# Patient Record
Sex: Female | Born: 1985 | Race: White | Hispanic: No | Marital: Single | Smoking: Current every day smoker
Health system: Southern US, Community
[De-identification: ages and names within clinical notes are randomized; demographics above are authoritative.]

## PROBLEM LIST (undated history)

## (undated) DIAGNOSIS — F988 Other specified behavioral and emotional disorders with onset usually occurring in childhood and adolescence: Secondary | ICD-10-CM

## (undated) DIAGNOSIS — F329 Major depressive disorder, single episode, unspecified: Secondary | ICD-10-CM

## (undated) DIAGNOSIS — F32A Depression, unspecified: Secondary | ICD-10-CM

## (undated) DIAGNOSIS — F1910 Other psychoactive substance abuse, uncomplicated: Secondary | ICD-10-CM

---
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
## 2005-05-24 ENCOUNTER — Other Ambulatory Visit: Admission: RE | Admit: 2005-05-24 | Discharge: 2005-05-24 | Payer: Self-pay | Admitting: Obstetrics and Gynecology

## 2005-10-22 ENCOUNTER — Inpatient Hospital Stay (HOSPITAL_COMMUNITY): Admission: AD | Admit: 2005-10-22 | Discharge: 2005-10-22 | Payer: Self-pay | Admitting: Obstetrics and Gynecology

## 2005-10-31 ENCOUNTER — Inpatient Hospital Stay (HOSPITAL_COMMUNITY): Admission: AD | Admit: 2005-10-31 | Discharge: 2005-10-31 | Payer: Self-pay | Admitting: Obstetrics and Gynecology

## 2005-11-02 ENCOUNTER — Inpatient Hospital Stay (HOSPITAL_COMMUNITY): Admission: AD | Admit: 2005-11-02 | Discharge: 2005-11-04 | Payer: Self-pay | Admitting: Obstetrics and Gynecology

## 2008-12-19 ENCOUNTER — Emergency Department (HOSPITAL_COMMUNITY): Admission: EM | Admit: 2008-12-19 | Discharge: 2008-12-20 | Payer: Self-pay | Admitting: Emergency Medicine

## 2009-10-29 ENCOUNTER — Inpatient Hospital Stay: Payer: Self-pay | Admitting: Psychiatry

## 2010-05-27 LAB — BASIC METABOLIC PANEL
CO2: 27 mEq/L (ref 19–32)
Calcium: 9.8 mg/dL (ref 8.4–10.5)
Creatinine, Ser: 0.69 mg/dL (ref 0.4–1.2)
GFR calc non Af Amer: >60 mL/min (ref >60)
Glucose, Bld: 99 mg/dL (ref 70–99)
Sodium: 138 mEq/L (ref 135–145)

## 2010-05-27 LAB — PREGNANCY, URINE

## 2010-05-27 LAB — DIFFERENTIAL
Basophils Absolute: 0 10*3/uL (ref 0.0–0.1)
Basophils Relative: 0 % (ref 0–1)
Eosinophils Absolute: 0.1 10*3/uL (ref 0.0–0.7)
Neutro Abs: 9 10*3/uL — ABNORMAL HIGH (ref 1.7–7.7)
Neutrophils Relative %: 66 % (ref 43–77)

## 2010-05-27 LAB — CBC
MCHC: 35 g/dL (ref 30.0–36.0)
MCV: 96 fL (ref 78.0–100.0)
Platelets: 326 10*3/uL (ref 150–400)
RBC: 4.55 MIL/uL (ref 3.87–5.11)

## 2010-05-27 LAB — ETHANOL

## 2010-05-27 LAB — RAPID URINE DRUG SCREEN, HOSP PERFORMED
Amphetamines: NOT DETECTED
Benzodiazepines: POSITIVE — AB
Opiates: POSITIVE — AB
Tetrahydrocannabinol: NOT DETECTED

## 2010-07-09 NOTE — Discharge Summary (Signed)
[name], [name] NO.:  [id number]   MEDICAL RECORD NO.:  [id number]          PATIENT TYPE:  INP   LOCATION:  9134                          FACILITY:  [facility]   PHYSICIAN:  Huel Cote, M.D. DATE OF BIRTH:  Sep 02, 1985   DATE OF ADMISSION:  11/02/2005  DATE OF DISCHARGE:  11/04/2005                                 DISCHARGE SUMMARY   DISCHARGE DIAGNOSES:  1. Term pregnancy at 38 plus weeks, delivered.  2. Pregnancy induced hypertension.  3. Status post spontaneous vaginal delivery with a moderate shoulder      dystocia.   DISCHARGE MEDICATIONS:  1. Motrin 600 mg p.o. every 6 hours.  2. Percocet 1-2 tablets p.o. every 4 hours p.r.n.   DISCHARGE FOLLOWUP:  The patient is to follow up in the office in six weeks  for her routine postpartum exam.   HOSPITAL COURSE:  The patient is a 25 year old G1, P0, who was admitted at  80 plus weeks gestation for induction of labor given increased blood  pressure noted for several weeks.  The patient had been followed closely in  the office with blood pressures reaching 160 to 190 over 100.  She had been  carefully screened for any other evidence of pre-eclampsia, however, her  labs were normal, her non-stress tests were normal, and she was  asymptomatic.  Her prenatal care was, otherwise, uneventful except for she  was a smoker prior to pregnancy and cut down to almost nothing with the  pregnancy.   Prenatal labs are as follows:  A+, antibody negative, RPR nonreactive,  rubella immune, hepatitis B surface antigen negative, HIV declined, GC  negative, chlamydia negative, group B strep negative, 1-hour Glucola 88,  quad screen normal.   Past medical history none.   Past surgical history none.   Past OB history none.   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 Pap smears were normal.   Medications included prenatal vitamins.   She is allergic to AMOXICILLIN and ZINC OXIDE.   On admission, blood  pressures were 140/87.  Fetal heart rate was reactive.  Contractions were irregular. Cervix was 50, 2-3, and -2 station.  She had  rupture of membranes performed with clear fluid noted and her PIH labs were  repeated and normal. She was asymptomatic. Her blood pressures remained  stable throughout labor and she reached complete dilation and pushed well  for approximately 1/2 hour with normal spontaneous vaginal delivery of a  viable female infant, Apgars were 6 and 8, weight was 7 pounds 1 ounce.  She  did have a moderate shoulder dystocia encountered which required suprapubic  pressure, McRoberts and a posterior shoulder wood screw maneuver to release  it, however, it did not appear to have any undue tension at any time on the  fetal vertex. Placenta delivered spontaneously.  Cord blood was donated.  First degree laceration was repaired with one interrupted suture of 2-0  Vicryl.  Estimated blood loss was 350 mL.  Cervix and rectum were intact.  The baby was taken to the nursery for some fast breathing, however, appeared  fine overall.  After delivery, the maternal temperature was noted to be  100.5 and given the baby had been tachycardiac just prior to delivery, we  did treat with two doses of clindamycin after delivery. Postpartum day two,  the patient was doing quite well.  Her blood pressure had markedly improved  after delivery.  Her temperature defervesced quickly after delivery and did  not return after her two doses of clindamycin. Postpartum hemoglobin was  10.5. On postpartum day two, she was feeling quite well and desired  discharge home.  She was discharged with medications and follow-up as  previously stated.      Huel Cote, M.D.  Electronically Signed     KR/MEDQ  D:  11/04/2005  T:  11/04/2005  Job:  [id number]

## 2013-06-29 ENCOUNTER — Encounter (HOSPITAL_COMMUNITY): Payer: Self-pay | Admitting: Emergency Medicine

## 2013-06-29 ENCOUNTER — Emergency Department (HOSPITAL_COMMUNITY): Payer: Self-pay

## 2013-06-29 ENCOUNTER — Emergency Department (HOSPITAL_COMMUNITY)
Admission: EM | Admit: 2013-06-29 | Discharge: 2013-06-30 | Payer: Self-pay | Attending: Emergency Medicine | Admitting: Emergency Medicine

## 2013-06-29 DIAGNOSIS — S298XXA Other specified injuries of thorax, initial encounter: Secondary | ICD-10-CM | POA: Insufficient documentation

## 2013-06-29 DIAGNOSIS — S161XXA Strain of muscle, fascia and tendon at neck level, initial encounter: Secondary | ICD-10-CM

## 2013-06-29 DIAGNOSIS — Y92410 Unspecified street and highway as the place of occurrence of the external cause: Secondary | ICD-10-CM | POA: Insufficient documentation

## 2013-06-29 DIAGNOSIS — S139XXA Sprain of joints and ligaments of unspecified parts of neck, initial encounter: Secondary | ICD-10-CM | POA: Insufficient documentation

## 2013-06-29 DIAGNOSIS — Y9389 Activity, other specified: Secondary | ICD-10-CM | POA: Insufficient documentation

## 2013-06-29 MED ORDER — IBUPROFEN 800 MG PO TABS
800.0000 mg | ORAL_TABLET | Freq: Once | ORAL | Status: AC
  Administered 2013-06-29: 800 mg via ORAL
  Filled 2013-06-29: qty 1

## 2013-06-29 NOTE — ED Notes (Signed)
Patient transported to X-ray 

## 2013-06-29 NOTE — ED Notes (Signed)
Pt placed in neck brace.

## 2013-06-29 NOTE — Discharge Instructions (Signed)
Rest, apply ice to your neck and chest for the next 24 hours on and off for about 15 minutes 3 times daily followed by heat. Take ibuprofen, 600 800 mg every 6-8 hours as needed for pain. Rest, avoid heavy lifting or hard physical activity.  Cervical Sprain A cervical sprain is an injury in the neck in which the strong, fibrous tissues (ligaments) that connect your neck bones stretch or tear. Cervical sprains can range from mild to severe. Severe cervical sprains can cause the neck vertebrae to be unstable. This can lead to damage of the spinal cord and can result in serious nervous system problems. The amount of time it takes for a cervical sprain to get better depends on the cause and extent of the injury. Most cervical sprains heal in 1 to 3 weeks. CAUSES  Severe cervical sprains may be caused by:   Contact sport injuries (such as from football, rugby, wrestling, hockey, auto racing, gymnastics, diving, martial arts, or boxing).   Motor vehicle collisions.   Whiplash injuries. This is an injury from a sudden forward-and backward whipping movement of the head and neck.  Falls.  Mild cervical sprains may be caused by:   Being in an awkward position, such as while cradling a telephone between your ear and shoulder.   Sitting in a chair that does not offer proper support.   Working at a poorly Marketing executivedesigned computer station.   Looking up or down for long periods of time.  SYMPTOMS   Pain, soreness, stiffness, or a burning sensation in the front, back, or sides of the neck. This discomfort may develop immediately after the injury or slowly, 24 hours or more after the injury.   Pain or tenderness directly in the middle of the back of the neck.   Shoulder or upper back pain.   Limited ability to move the neck.   Headache.   Dizziness.   Weakness, numbness, or tingling in the hands or arms.   Muscle spasms.   Difficulty swallowing or chewing.   Tenderness and  swelling of the neck.  DIAGNOSIS  Most of the time your health care provider can diagnose a cervical sprain by taking your history and doing a physical exam. Your health care provider will ask about previous neck injuries and any known neck problems, such as arthritis in the neck. X-rays may be taken to find out if there are any other problems, such as with the bones of the neck. Other tests, such as a CT scan or MRI, may also be needed.  TREATMENT  Treatment depends on the severity of the cervical sprain. Mild sprains can be treated with rest, keeping the neck in place (immobilization), and pain medicines. Severe cervical sprains are immediately immobilized. Further treatment is done to help with pain, muscle spasms, and other symptoms and may include:  Medicines, such as pain relievers, numbing medicines, or muscle relaxants.   Physical therapy. This may involve stretching exercises, strengthening exercises, and posture training. Exercises and improved posture can help stabilize the neck, strengthen muscles, and help stop symptoms from returning.  HOME CARE INSTRUCTIONS   Put ice on the injured area.   Put ice in a plastic bag.   Place a towel between your skin and the bag.   Leave the ice on for 15 20 minutes, 3 4 times a day.   If your injury was severe, you may have been given a cervical collar to wear. A cervical collar is a two-piece collar designed  to keep your neck from moving while it heals.  Do not remove the collar unless instructed by your health care provider.  If you have long hair, keep it outside of the collar.  Ask your health care provider before making any adjustments to your collar. Minor adjustments may be required over time to improve comfort and reduce pressure on your chin or on the back of your head.  Ifyou are allowed to remove the collar for cleaning or bathing, follow your health care provider's instructions on how to do so safely.  Keep your collar  clean by wiping it with mild soap and water and drying it completely. If the collar you have been given includes removable pads, remove them every 1 2 days and hand wash them with soap and water. Allow them to air dry. They should be completely dry before you wear them in the collar.  If you are allowed to remove the collar for cleaning and bathing, wash and dry the skin of your neck. Check your skin for irritation or sores. If you see any, tell your health care provider.  Do not drive while wearing the collar.   Only take over-the-counter or prescription medicines for pain, discomfort, or fever as directed by your health care provider.   Keep all follow-up appointments as directed by your health care provider.   Keep all physical therapy appointments as directed by your health care provider.   Make any needed adjustments to your workstation to promote good posture.   Avoid positions and activities that make your symptoms worse.   Warm up and stretch before being active to help prevent problems.  SEEK MEDICAL CARE IF:   Your pain is not controlled with medicine.   You are unable to decrease your pain medicine over time as planned.   Your activity level is not improving as expected.  SEEK IMMEDIATE MEDICAL CARE IF:   You develop any bleeding.  You develop stomach upset.  You have signs of an allergic reaction to your medicine.   Your symptoms get worse.   You develop new, unexplained symptoms.   You have numbness, tingling, weakness, or paralysis in any part of your body.  MAKE SURE YOU:   Understand these instructions.  Will watch your condition.  Will get help right away if you are not doing well or get worse. Document Released: 12/05/2006 Document Revised: 11/28/2012 Document Reviewed: 08/15/2012 [facility] Patient Information 2014 [place] [place].  Motor Vehicle Collision  It is common to have multiple bruises and sore muscles after a motor vehicle  collision (MVC). These tend to feel worse for the first 24 hours. You may have the most stiffness and soreness over the first several hours. You may also feel worse when you wake up the first morning after your collision. After this point, you will usually begin to improve with each day. The speed of improvement often depends on the severity of the collision, the number of injuries, and the location and nature of these injuries. HOME CARE INSTRUCTIONS   Put ice on the injured area.  Put ice in a plastic bag.  Place a towel between your skin and the bag.  Leave the ice on for 15-20 minutes, 03-04 times a day.  Drink enough fluids to keep your urine clear or pale yellow. Do not drink alcohol.  Take a warm shower or bath once or twice a day. This will increase blood flow to sore muscles.  You may return to activities as directed  by your caregiver. Be careful when lifting, as this may aggravate neck or back pain.  Only take over-the-counter or prescription medicines for pain, discomfort, or fever as directed by your caregiver. Do not use aspirin. This may increase bruising and bleeding. SEEK IMMEDIATE MEDICAL CARE IF:  You have numbness, tingling, or weakness in the arms or legs.  You develop severe headaches not relieved with medicine.  You have severe neck pain, especially tenderness in the middle of the back of your neck.  You have changes in bowel or bladder control.  There is increasing pain in any area of the body.  You have shortness of breath, lightheadedness, dizziness, or fainting.  You have chest pain.  You feel sick to your stomach (nauseous), throw up (vomit), or sweat.  You have increasing abdominal discomfort.  There is blood in your urine, stool, or vomit.  You have pain in your shoulder (shoulder strap areas).  You feel your symptoms are getting worse. MAKE SURE YOU:   Understand these instructions.  Will watch your condition.  Will get help right away if  you are not doing well or get worse. Document Released: 02/07/2005 Document Revised: 05/02/2011 Document Reviewed: 07/07/2010 [facility] Patient Information 2014 [place], [place].
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
## 2013-06-30 NOTE — ED Provider Notes (Signed)
Medical screening examination/treatment/procedure(s) were performed by non-physician practitioner and as supervising physician I was immediately available for consultation/collaboration.    Vida RollerBrian D Chala Gul, MD 06/30/13 [place] Bruns0030

## 2014-08-27 ENCOUNTER — Emergency Department (HOSPITAL_COMMUNITY)
Admission: EM | Admit: 2014-08-27 | Discharge: 2014-08-27 | Disposition: A | Discharge status: Home / Self Care | Payer: Self-pay | Attending: Emergency Medicine | Admitting: Emergency Medicine

## 2014-08-27 ENCOUNTER — Encounter (HOSPITAL_COMMUNITY): Payer: Self-pay | Admitting: *Deleted

## 2014-08-27 DIAGNOSIS — Z720 Tobacco use: Secondary | ICD-10-CM | POA: Insufficient documentation

## 2014-08-27 DIAGNOSIS — F909 Attention-deficit hyperactivity disorder, unspecified type: Secondary | ICD-10-CM | POA: Insufficient documentation

## 2014-08-27 DIAGNOSIS — L02413 Cutaneous abscess of right upper limb: Secondary | ICD-10-CM | POA: Insufficient documentation

## 2014-08-27 DIAGNOSIS — L0291 Cutaneous abscess, unspecified: Secondary | ICD-10-CM

## 2014-08-27 DIAGNOSIS — F329 Major depressive disorder, single episode, unspecified: Secondary | ICD-10-CM | POA: Insufficient documentation

## 2014-08-27 DIAGNOSIS — Z79899 Other long term (current) drug therapy: Secondary | ICD-10-CM | POA: Insufficient documentation

## 2014-08-27 DIAGNOSIS — L03113 Cellulitis of right upper limb: Secondary | ICD-10-CM | POA: Insufficient documentation

## 2014-08-27 HISTORY — DX: Major depressive disorder, single episode, unspecified: F32.9

## 2014-08-27 HISTORY — DX: Depression, unspecified: F32.A

## 2014-08-27 HISTORY — DX: Other specified behavioral and emotional disorders with onset usually occurring in childhood and adolescence: F98.8

## 2014-08-27 LAB — I-STAT CG4 LACTIC ACID, ED: Lactic Acid, Venous: <0.3 mmol/L — ABNORMAL LOW (ref 0.5–2.0)

## 2014-08-27 MED ORDER — ACETAMINOPHEN 325 MG PO TABS
650.0000 mg | ORAL_TABLET | Freq: Once | ORAL | Status: AC
  Administered 2014-08-27: 650 mg via ORAL
  Filled 2014-08-27: qty 2

## 2014-08-27 MED ORDER — SULFAMETHOXAZOLE-TRIMETHOPRIM 800-160 MG PO TABS
1.0000 | ORAL_TABLET | Freq: Once | ORAL | Status: AC
  Administered 2014-08-27: 1 via ORAL
  Filled 2014-08-27: qty 1

## 2014-08-27 MED ORDER — SULFAMETHOXAZOLE-TRIMETHOPRIM 800-160 MG PO TABS: 1.0000 | ORAL_TABLET | Freq: Two times a day (BID) | ORAL | Status: AC

## 2014-08-27 MED ORDER — LIDOCAINE-EPINEPHRINE 2 %-1:100000 IJ SOLN
20.0000 mL | Freq: Once | INTRAMUSCULAR | Status: AC
  Administered 2014-08-27: 20 mL
  Filled 2014-08-27: qty 1

## 2014-08-27 NOTE — ED Notes (Signed)
Attending at the beside.

## 2014-08-27 NOTE — ED Provider Notes (Signed)
CSN: [id number]     Arrival date & time 08/27/14  1405 History   First MD Initiated Contact with Patient 08/27/14 1423     Chief Complaint  Patient presents with  . Abscess    HPI   29 year old female presents today with an abscess to her right forearm. 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 She states over the past 2 weeks she's had tenderness to the forearm,  reports today she developed swelling and pain to the right forearm yesterday. Patient denies dizziness, fever, chills, nausea, vomiting. No history of soft tissue infections previously. No pain with flexion of the elbow, minor surrounding redness to the abscess.  Past Medical History  Diagnosis Date  . ADD (attention deficit disorder)   . Depression    No past surgical history on file. No family history on file. History  Substance Use Topics  . Smoking status: Current Every Day Smoker -- 1.00 packs/day for 10 years    Types: Cigarettes  . Smokeless tobacco: Not on file  . Alcohol Use: No   OB History    No data available     Review of Systems  All other systems reviewed and are negative.   Allergies  Zinc oxide  Home Medications   Prior to Admission medications   Medication Sig Start Date End Date Taking? Authorizing Provider  ALPRAZolam Prudy Feeler(XANAX) 0.5 MG tablet Take 0.5 mg by mouth 3 (three) times daily as needed. Anxiety 08/03/14  Yes Historical Provider, MD  citalopram (CELEXA) 20 MG tablet Take 20 mg by mouth daily.   Yes Historical Provider, MD  amphetamine-dextroamphetamine (ADDERALL) 30 MG tablet Take 1 tab twice a day (rx 3 of 3) 09/23/14   Historical Provider, MD  sulfamethoxazole-trimethoprim (BACTRIM DS,SEPTRA DS) 800-160 MG per tablet Take 1 tablet by mouth 2 (two) times daily. 08/27/14 09/03/14  Tinnie GensJeffrey Shilah Hefel, PA-C   BP 129/82 mmHg  Pulse 127  Temp(Src) 100.2 F (37.9 C) (Oral)  Resp 19  SpO2 100%   Physical Exam  Constitutional: She is oriented to  person, place, and time. She appears well-developed and well-nourished.  HENT:  Head: Normocephalic and atraumatic.  Eyes: Conjunctivae are normal. Pupils are equal, round, and reactive to light. Right eye exhibits no discharge. Left eye exhibits no discharge. No scleral icterus.  Neck: Normal range of motion. No JVD present. No tracheal deviation present.  Pulmonary/Chest: Effort normal. No stridor.  Musculoskeletal:  Distal sensation, perfusion, and grip strength intact. Radial pulse 2+. Pain free ROM of elbow  Neurological: She is alert and oriented to person, place, and time. Coordination normal.  Skin: Skin is warm and dry.  3 cm abscess to right forearm, surrounding cellulitis.   Psychiatric: She has a normal mood and affect. Her behavior is normal. Judgment and thought content normal.  Nursing note and vitals reviewed.     ED Course  Procedures (including critical care time)  EMERGENCY DEPARTMENT US SOFT TISSUE INTERPRETATION "Study: Limited Ultrasound of the noted body part in comments below"  INDICATIONS: Soft tissue infection Multiple views of the body part are obtained with a multi-frequency linear probe  PERFORMED BY:  Myself  IMAGES ARCHIVED?: Yes  SIDE:Right   BODY PART:Upper extremity  FINDINGS: Abcess present  LIMITATIONS:  Body Habitus  INTERPRETATION:  Abcess present  COMMENT:     INCISION AND DRAINAGE Performed by: Thermon LeylandHedges,Aleenah Homen Todd Consent: Verbal consent obtained. Risks and benefits: risks, benefits and alternatives were discussed Type:  abscess  Body area: right forearm  Anesthesia: local infiltration  Incision was made with a scalpel.  Local anesthetic: lidocaine 2%  epinephrine  Anesthetic total: 3 ml  Complexity: complex Blunt dissection to break up loculations  Drainage: purulent  Drainage amount: 5 cc  Patient tolerance: Patient tolerated the procedure well with no immediate complications.   Labs Review Labs Reviewed   I-STAT CG4 LACTIC ACID, ED - Abnormal; Notable for the following:    Lactic Acid, Venous <0.30 (*)    All other components within normal limits    Imaging Review No results found.   EKG Interpretation None      MDM   Final diagnoses:  Abscess  Cellulitis of right upper extremity   Labs: i stat lactic acid - no sig findings   Imaging:  Consults:  Therapeutics:   Assessment: tylenol,bactrim, epi  Plan: PT presents with abscess to right arm. She has some surrounding cellulitis that extends approximately 3-5 cm surrounding the abscess. I&D was successful, patient and the procedure well. Patient has full pain-free range of motion of elbow and wrist hand, with no suspicion of intra-articular involvement. Patient was given dose of Bactrim here in the ED, prescription for home. She is instructed follow-up in 23 days with her primary care provider for further evaluation and recheck of abscess. She is given strict return precautions, instructions for wound care. Patient verbalizes understanding and agreement with today's plan and assured her follow-up evaluation. Should no further questions or concerns at time of discharge      Eyvonne Mechanic, PA-C 08/28/14 0731  Eyvonne Mechanic, PA-C 08/28/14 [id number]  Linwood Dibbles, MD 08/28/14 [phone]

## 2014-08-27 NOTE — ED Notes (Signed)
Pt reports she is an IV heroine user. Injected into right forearm 2 weeks ago, since then has had slight redness, soreness, and swelling. Pt injected heroine into right palm 4 days ago. Last heroine use yesterday. Pt moving to [facility] [facility] soon.Today red area became larger and more tender, pt bumped on a piece of wicker furniture and pain increased. Pain 8/10 at present. Large red area to right forearm with swelling.

## 2014-08-27 NOTE — Discharge Instructions (Signed)
Abscess An abscess is an infected area that contains a collection of pus and debris.It can occur in almost any part of the body. An abscess is also known as a furuncle or boil. CAUSES  An abscess occurs when tissue gets infected. This can occur from blockage of oil or sweat glands, infection of hair follicles, or a minor injury to the skin. As the body tries to fight the infection, pus collects in the area and creates pressure under the skin. This pressure causes pain. People with weakened immune systems have difficulty fighting infections and get certain abscesses more often.  SYMPTOMS Usually an abscess develops on the skin and becomes a painful mass that is red, warm, and tender. If the abscess forms under the skin, you may feel a moveable soft area under the skin. Some abscesses break open (rupture) on their own, but most will continue to get worse without care. The infection can spread deeper into the body and eventually into the bloodstream, causing you to feel ill.  DIAGNOSIS  Your caregiver will take your medical history and perform a physical exam. A sample of fluid may also be taken from the abscess to determine what is causing your infection. TREATMENT  Your caregiver may prescribe antibiotic medicines to fight the infection. However, taking antibiotics alone usually does not cure an abscess. Your caregiver may need to make a small cut (incision) in the abscess to drain the pus. In some cases, gauze is packed into the abscess to reduce pain and to continue draining the area. HOME CARE INSTRUCTIONS   Only take over-the-counter or prescription medicines for pain, discomfort, or fever as directed by your caregiver.  If you were prescribed antibiotics, take them as directed. Finish them even if you start to feel better.  If gauze is used, follow your caregiver's directions for changing the gauze.  To avoid spreading the infection:  Keep your draining abscess covered with a  bandage.  Wash your hands well.  Do not share personal care items, towels, or whirlpools with others.  Avoid skin contact with others.  Keep your skin and clothes clean around the abscess.  Keep all follow-up appointments as directed by your caregiver. SEEK MEDICAL CARE IF:   You have increased pain, swelling, redness, fluid drainage, or bleeding.  You have muscle aches, chills, or a general ill feeling.  You have a fever. MAKE SURE YOU:   Understand these instructions.  Will watch your condition.  Will get help right away if you are not doing well or get worse. Document Released: 11/17/2004 Document Revised: 08/09/2011 Document Reviewed: 04/22/2011 [facility] Patient Information 2015 [place]. This information is not intended to replace advice given to you by your health care provider. Make sure you discuss any questions you have with your health care provider.    Abscess Care After An abscess (also called a boil or furuncle) is an infected area that contains a collection of pus. Signs and symptoms of an abscess include pain, tenderness, redness, or hardness, or you may feel a moveable soft area under your skin. An abscess can occur anywhere in the body. The infection may spread to surrounding tissues causing cellulitis. A cut (incision) by the surgeon was made over your abscess and the pus was drained out. Gauze may have been packed into the space to provide a drain that will allow the cavity to heal from the inside outwards. The boil may be painful for 5 to 7 days. Most people with a boil do  not have high fevers. Your abscess, if seen early, may not have localized, and may not have been lanced. If not, another appointment may be required for this if it does not get better on its own or with medications. HOME CARE INSTRUCTIONS   Only take over-the-counter or prescription medicines for pain, discomfort, or fever as directed by your caregiver.  When you bathe, soak and then  remove gauze or iodoform packs at least daily or as directed by your caregiver. You may then wash the wound gently with mild soapy water. Repack with gauze or do as your caregiver directs. SEEK IMMEDIATE MEDICAL CARE IF:   You develop increased pain, swelling, redness, drainage, or bleeding in the wound site.  You develop signs of generalized infection including muscle aches, chills, fever, or a general ill feeling.  An oral temperature above 102 F (38.9 C) develops, not controlled by medication. See your caregiver for a recheck if you develop any of the symptoms described above. If medications (antibiotics) were prescribed, take them as directed. Document Released: 08/26/2004 Document Revised: 05/02/2011 Document Reviewed: 04/23/2007 [facility] Patient Information 2015 [place], [place]. This information is not intended to replace advice given to you by your health care provider. Make sure you discuss any questions you have with your health care provider.   Please monitor for new or worsening signs and symptoms, return immediately if any present. Please follow-up with your primary care provider in 2-3 days for reevaluation. Please read attached information, warm water soaks twice daily until wound heals.

## 2015-05-29 ENCOUNTER — Emergency Department (HOSPITAL_COMMUNITY)
Admission: EM | Admit: 2015-05-29 | Discharge: 2015-05-30 | Disposition: A | Discharge status: Other Acute Inpatient Hospital | Payer: No Typology Code available for payment source | Attending: Emergency Medicine | Admitting: Emergency Medicine

## 2015-05-29 ENCOUNTER — Encounter (HOSPITAL_COMMUNITY): Payer: Self-pay | Admitting: Behavioral Health

## 2015-05-29 ENCOUNTER — Encounter (HOSPITAL_COMMUNITY): Payer: Self-pay | Admitting: *Deleted

## 2015-05-29 DIAGNOSIS — R45851 Suicidal ideations: Secondary | ICD-10-CM

## 2015-05-29 DIAGNOSIS — F17210 Nicotine dependence, cigarettes, uncomplicated: Secondary | ICD-10-CM | POA: Insufficient documentation

## 2015-05-29 DIAGNOSIS — F329 Major depressive disorder, single episode, unspecified: Secondary | ICD-10-CM

## 2015-05-29 DIAGNOSIS — F1110 Opioid abuse, uncomplicated: Secondary | ICD-10-CM | POA: Insufficient documentation

## 2015-05-29 DIAGNOSIS — Z3202 Encounter for pregnancy test, result negative: Secondary | ICD-10-CM | POA: Insufficient documentation

## 2015-05-29 DIAGNOSIS — F32A Depression, unspecified: Secondary | ICD-10-CM

## 2015-05-29 HISTORY — DX: Other psychoactive substance abuse, uncomplicated: F19.10

## 2015-05-29 LAB — COMPREHENSIVE METABOLIC PANEL
ALK PHOS: 100 U/L (ref 38–126)
ALT: 29 U/L (ref 14–54)
AST: 26 U/L (ref 15–41)
Albumin: 4.4 g/dL (ref 3.5–5.0)
Anion gap: 10 (ref 5–15)
BILIRUBIN TOTAL: 0.7 mg/dL (ref 0.3–1.2)
BUN: 10 mg/dL (ref 6–20)
CALCIUM: 10.1 mg/dL (ref 8.9–10.3)
CHLORIDE: 108 mmol/L (ref 101–111)
CO2: 21 mmol/L — ABNORMAL LOW (ref 22–32)
CREATININE: 0.82 mg/dL (ref 0.44–1.00)
GFR calc Af Amer: >60 mL/min (ref >60)
Glucose, Bld: 105 mg/dL — ABNORMAL HIGH (ref 65–99)
Potassium: 4.9 mmol/L (ref 3.5–5.1)
Sodium: 139 mmol/L (ref 135–145)
TOTAL PROTEIN: 7.6 g/dL (ref 6.5–8.1)

## 2015-05-29 LAB — CBC
HCT: 44.1 % (ref 36.0–46.0)
Hemoglobin: 15 g/dL (ref 12.0–15.0)
MCH: 30.8 pg (ref 26.0–34.0)
MCHC: 34 g/dL (ref 30.0–36.0)
MCV: 90.6 fL (ref 78.0–100.0)
PLATELETS: 313 10*3/uL (ref 150–400)
RBC: 4.87 MIL/uL (ref 3.87–5.11)
RDW: 12.5 % (ref 11.5–15.5)
WBC: 12.8 10*3/uL — AB (ref 4.0–10.5)

## 2015-05-29 LAB — RAPID URINE DRUG SCREEN, HOSP PERFORMED
Amphetamines: NOT DETECTED
BENZODIAZEPINES: NOT DETECTED
Barbiturates: NOT DETECTED
COCAINE: NOT DETECTED
OPIATES: NOT DETECTED
Tetrahydrocannabinol: NOT DETECTED

## 2015-05-29 LAB — I-STAT BETA HCG BLOOD, ED (MC, WL, AP ONLY): I-stat hCG, quantitative: <5 m[IU]/mL (ref <5)

## 2015-05-29 LAB — ETHANOL

## 2015-05-29 MED ORDER — LORAZEPAM 1 MG PO TABS
0.5000 mg | ORAL_TABLET | Freq: Once | ORAL | Status: AC
  Administered 2015-05-29: 0.5 mg via ORAL
  Filled 2015-05-29: qty 1

## 2015-05-29 MED ORDER — NICOTINE 21 MG/24HR TD PT24
21.0000 mg | MEDICATED_PATCH | Freq: Once | TRANSDERMAL | Status: DC
  Administered 2015-05-29: 21 mg via TRANSDERMAL
  Filled 2015-05-29: qty 1

## 2015-05-29 NOTE — ED Notes (Signed)
[facility] and was sent here today for medical clearance before she can be admitted.  She was to be evaluated for Depression and "not wanting to be alive", even though she has no specific plans of suicide. States she has been abusing Kratom and needs help withdrawing from.  Pt last used Kratom at 6 pm last night.

## 2015-05-29 NOTE — ED Notes (Signed)
[facility].

## 2015-05-29 NOTE — ED Notes (Signed)
Pt phone placed in front zipper of suitcase, pt aware and agreeable.

## 2015-05-29 NOTE — ED Provider Notes (Signed)
CSN: [id number]     Arrival date & time 05/29/15  1429 History   First Traci [name] Initiated Contact with Patient 05/29/15 1635     Chief Complaint  Patient presents with  . Medical Clearance  . Suicidal   (Consider location/radiation/quality/duration/timing/severity/associated sxs/prior Treatment) The history is provided by the patient. No language interpreter was used.    Traci [name] is a 30 year old female with a history of substance abuse, depression, and ADD who presents from [facility] for medical clearance after being evaluated for depression and not wanting to be alive. She does not have a specific plan of suicide. She also admits to abusing Kratom and wants help withdrawing from this. She last used at 6 PM last night. She denies any homicidal ideation. No hallucinations. Denies any alcohol or illegal substance abuse besides Kratom. States she used to take depression and anxiety medication until 10 months ago. She also denies any chest pain, shortness of breath, abdominal pain, nausea, vomiting.   Past Medical History  Diagnosis Date  . ADD (attention deficit disorder)   . Depression   . Substance abuse    History reviewed. No pertinent past surgical history. No family history on file. Social History  Substance Use Topics  . Smoking status: Current Every Day Smoker -- 1.00 packs/day for 10 years    Types: Cigarettes  . Smokeless tobacco: None  . Alcohol Use: No   OB History    No data available     Review of Systems  All other systems reviewed and are negative.     Allergies  Zinc oxide  Home Medications   Prior to Admission medications   Medication Sig Start Date End Date Taking? Authorizing Provider  acetaminophen (TYLENOL) 325 MG tablet Take 650 mg by mouth every 6 (six) hours as needed for moderate pain.   Yes Historical Provider, Traci [name]  Ascorbic Acid (VITAMIN C PO) Take 1 tablet by mouth daily as needed (FOR IMMUNE SUPPORT).   Yes Historical Provider, Traci [name]  Ibuprofen (MIDOL)  200 MG CAPS Take 200 mg by mouth daily as needed (FOR CRAMPS).   Yes Historical Provider, Traci [name]   BP 124/82 mmHg  Pulse 82  Temp(Src) 98.2 F (36.8 C) (Oral)  Resp 16  Ht  (1.626 m)  Wt 72.576 kg  BMI 27.45 kg/m2  SpO2 100%  LMP 05/08/2015 Physical Exam  Constitutional: She is oriented to person, place, and time. She appears well-developed and well-nourished.  HENT:  Head: Normocephalic and atraumatic.  Eyes: Conjunctivae are normal.  Neck: Normal range of motion. Neck supple.  Cardiovascular: Normal rate.   Pulmonary/Chest: Effort normal. No respiratory distress.  Abdominal: Soft.  Musculoskeletal: Normal range of motion.  Neurological: She is alert and oriented to person, place, and time.  Skin: Skin is warm and dry.  Psychiatric: She exhibits a depressed mood. She expresses suicidal ideation. She expresses no suicidal plans.  Depressed. Suicidal but no plan.  Nursing note and vitals reviewed.   ED Course  Procedures (including critical care time) Labs Review Labs Reviewed  COMPREHENSIVE METABOLIC PANEL - Abnormal; Notable for the following:    CO2 21 (*)    Glucose, Bld 105 (*)    All other components within normal limits  CBC - Abnormal; Notable for the following:    WBC 12.8 (*)    All other components within normal limits  ETHANOL  URINE RAPID DRUG SCREEN, HOSP PERFORMED  I-STAT BETA HCG BLOOD, ED ([facility], [facility], [facility] ONLY)    Imaging Review  No results found. I have personally reviewed and evaluated these lab results as part of my medical decision-making.   EKG Interpretation None      MDM   Final diagnoses:  Suicidal ideation  Depression  Opiate abuse, episodic   Patient presents for suicidal ideation and depression. Seen by behavioral health earlier today and sent to the ED for medical clearance. Vitals stable and patient is well-appearing. UDS is negative. Labs unremarkable. She is not pregnant.  Behavioral Health recommended inpatient treatment.  There are no home meds to order.  Patient is stable and in no acute distress.   Dr. Dub MikesLugo has accepted the patient. EMTALA was completed. Patient is stable.  Filed Vitals:   05/29/15 2327 05/29/15 2349  BP: 120/77 124/82  Pulse: 86 82  Temp: 98.2 F (36.8 C) 98.2 F (36.8 C)  Resp:     Medications  nicotine (NICODERM CQ - dosed in mg/24 hours) patch 21 mg (21 mg Transdermal Patch Applied 05/29/15 1611)  LORazepam (ATIVAN) tablet 0.5 mg (0.5 mg Oral Given 05/29/15 1725)     Traci GosselinHanna Patel-Mills, Traci [name] 05/30/15 0032  Traci BarretteMarcy Pfeiffer, Traci [name] 05/30/15 [phone]

## 2015-05-29 NOTE — ED Notes (Signed)
Called staffing for sitter 

## 2015-05-29 NOTE — ED Notes (Signed)
Sitter at bedside.

## 2015-05-29 NOTE — ED Notes (Signed)
[facility]

## 2015-05-29 NOTE — Progress Notes (Signed)
[facility] to Dr. Dub Mikes[facility]ugo, room 306-1, patient to arrive before midnight per [facility]. RN call report at [phone]. [facility] RN Nadine CountsBob has been informed.  Melbourne Abtsatia Giana Castner, LCSWA Disposition staff 05/29/2015 10:48 PM

## 2015-05-29 NOTE — BH Assessment (Addendum)
Assessment Note  Traci [name] is a Caucasian 30 y.o. female [facility].  She complained of suicidal ideation with plan and mild withdrawal symptoms from use of Kratom.  Pt reported as follows:  She is 30 years old and [facility] where she is in recovery from heroin and other opioid use.  Over the last three months, she has self-medicated her anxiety with Kratom.  She reported that two days ago, she abruptly stopped using Kratom and is now experiencing suicidal ideation with plan (overdose on heroin) and body aches.  Client indicated that she has attempted suicide twice before -- once at age 30 (overdose on "some pills") and once in her 4020s (not sure of the date), at which time she threatened to kill herself with a weapon.  Pt stated that she is currently suicidal and she is worried that if discharged, she would harm herself.  In addition to suicidal ideation, Pt endorsed tearfulness, persistent and long-standing sadness, feelings of hopelessness, poor appetite, and insomnia.  She reported first using opioids at age 30 ("pills") and also use of heroin.  Last use of heroin was approximately nine months ago.  During assessment, Pt presented in street clothes and appeared well-groomed.  She had good eye contact and was cooperative.  Pt's demeanor was pleasant.  She endorsed suicidal ideation with plan.  She denied any history of homicidal ideation, and she also endorsed a history of self-injury (cutting -- cannot recall last date).  Pt endorsed a history of opioid use and recent use of Kratom.  Pt denied auditory/visual hallucination, and there was no evidence of delusion.  Pt's memory and concentration were intact.  Judgment and impulse control are poor as evidenced by recent/current suicidal ideation.  Pt's speech was normal in rate, rhythm, and volume.  Consulted with Chilton Greathouse. Lewis, FNP, who determined that Pt meets inpatient criteria.  [facility]; Pt to  be placed inpatient.  Diagnosis: Major Depressive Disorder, Severe; r/o Substance-induced Mood Disorder  Past Medical History:  Past Medical History  Diagnosis Date  . ADD (attention deficit disorder)   . Depression     No past surgical history on file.  Family History: No family history on file.  Social History:  reports that she has been smoking Cigarettes.  She has a 10 pack-year smoking history. She does not have any smokeless tobacco history on file. She reports that she uses illicit drugs (Other-see comments). She reports that she does not drink alcohol.  Additional Social History:  Alcohol / Drug Use Pain Medications: See PTA Prescriptions: See PTA Over the Counter: See PTA History of alcohol / drug use?: Yes Substance #1 Name of Substance 1: Heroin/Opioids 1 - Age of First Use: 20 1 - Amount (size/oz): Varied 1 - Frequency: Episodic 1 - Duration: 8 years 1 - Last Use / Amount: Approximately July 2016 Substance #2 Name of Substance 2: Kratom 2 - Age of First Use: [age] 2 - Amount (size/oz): Varied 2 - Frequency: Daily 2 - Duration: Ongoing 2 - Last Use / Amount: 05/27/15 (Pt indicated some withdrawal symptoms)  CIWA:   COWS:    Allergies:  Allergies  Allergen Reactions  . Zinc Oxide Itching    Home Medications:  (Not in a hospital admission)  OB/GYN Status:  No LMP recorded. Patient is not currently having periods (Reason: IUD).  General Assessment Data [facility] TTS Assessment: In system Is this a  Tele or Face-to-Face Assessment?: Face-to-Face Is this an Initial Assessment or a Re-assessment for this encounter?: Initial Assessment Marital status: Single Is patient pregnant?: No Pregnancy Status: No Living Arrangements: Group Home [facility]) Can pt return to current living arrangement?: Yes Admission Status: Voluntary Is patient capable of signing voluntary admission?: Yes Referral Source:  Self/Family/Friend Insurance type: Self-pay  Medical Screening Exam [facility] Walk-in ONLY) Medical Exam completed: No Reason for MSE not completed: Other: (Pt [facility] to [facility])  Crisis Care Plan Living Arrangements: Group [facility])  Education Status Is patient currently in school?: No  Risk to self with the past 6 months Suicidal Ideation: Yes-Currently Present Has patient been a risk to self within the past 6 months prior to admission? : Other (comment) Suicidal Intent: Yes-Currently Present Has patient had any suicidal intent within the past 6 months prior to admission? : No Is patient at risk for suicide?: Yes Suicidal Plan?: Yes-Currently Present Has patient had any suicidal plan within the past 6 months prior to admission? : No Specify Current Suicidal Plan: OD on heroin Access to Means: No What has been your use of drugs/alcohol within the last 12 months?: Heroin, Kratom Previous Attempts/Gestures: Yes How many times?: 2 Other Self Harm Risks: History of cutting Triggers for Past Attempts: Unknown Intentional Self Injurious Behavior: Cutting Comment - Self Injurious Behavior: Hx of self-injury Family Suicide History: Unknown Recent stressful life event(s): Other (Comment) (Abrupt end to use of Kratom) Persecutory voices/beliefs?: No Depression: Yes Depression Symptoms: Despondent, Tearfulness, Isolating, Feeling worthless/self pity Substance abuse history and/or treatment for substance abuse?: Yes Suicide prevention information given to non-admitted patients: Not applicable  Risk to Others within the past 6 months Homicidal Ideation: No Does patient have any lifetime risk of violence toward others beyond the six months prior to admission? : No Thoughts of Harm to Others: No Current Homicidal Intent: No Current Homicidal Plan: No Access to Homicidal Means: No History of harm to others?: No Assessment of Violence: None Noted Does patient have access to  weapons?: No Criminal Charges Pending?: No Does patient have a court date: No Is patient on probation?: No  Psychosis Hallucinations: None noted Delusions: None noted  Mental Status Report Appearance/Hygiene: Unremarkable (Street clothes) Eye Contact: Good Motor Activity: Unremarkable Speech: Unremarkable Level of Consciousness: Alert Mood: Sad Affect: Sad Anxiety Level: None Thought Processes: Coherent, Relevant Judgement: Unimpaired Orientation: Person, Time, Place, Situation Obsessive Compulsive Thoughts/Behaviors: None  Cognitive Functioning Concentration: Normal Memory: Recent Intact, Remote Intact IQ: Average Insight: Good Impulse Control: Poor Appetite: Fair Sleep: Decreased Vegetative Symptoms: None  ADLScreening [facility] Assessment Services) Patient's cognitive ability adequate to safely complete daily activities?: Yes Patient able to express need for assistance with ADLs?: Yes Independently performs ADLs?: Yes (appropriate for developmental age)  Prior Inpatient Therapy Prior Inpatient Therapy: Yes Prior Therapy Dates: Could not recall specific dates Prior Therapy Facilty/Provider(s): "[facility]" and [facility] Reason for Treatment: Suicide attempts  Prior Outpatient Therapy Prior Outpatient Therapy: Yes Prior Therapy Dates: 2016 Prior Therapy Facilty/Provider(s): [facility] Reason for Treatment: Substance use -- heroin Does patient have an ACCT team?: No Does patient have Intensive In-House Services?  : No Does patient have [facility] services? : No Does patient have P4CC services?: No  ADL Screening (condition at time of admission) Patient's cognitive ability adequate to safely complete daily activities?: Yes Is the patient deaf or have difficulty hearing?: No Does the patient have difficulty seeing, even when wearing glasses/contacts?: No Does the patient have difficulty concentrating, remembering, or making  decisions?: No Patient able to express need for  assistance with ADLs?: Yes Does the patient have difficulty dressing or bathing?: No Independently performs ADLs?: Yes (appropriate for developmental age) Does the patient have difficulty walking or climbing stairs?: No Weakness of Legs: None Weakness of Arms/Hands: None       Abuse/Neglect Assessment (Assessment to be complete while patient is alone) Physical Abuse: Denies Verbal Abuse: Denies Sexual Abuse: Denies Exploitation of patient/patient's resources: Denies Self-Neglect: Denies Values / Beliefs Cultural Requests During Hospitalization: None Spiritual Requests During Hospitalization: None Consults Spiritual Care Consult Needed: No Social Work Consult Needed: No Merchant navy officer (For Healthcare) Does patient have an advance directive?: No Would patient like information on creating an advanced directive?: No - patient declined information    Additional Information 1:1 In Past 12 Months?: No CIRT Risk: No Elopement Risk: No Does patient have medical clearance?: No     Disposition:  Disposition Initial Assessment Completed for this Encounter: Yes Disposition of Patient: Inpatient treatment program Type of inpatient treatment program: Adult (Per T. Lewis, Pt meets inpt criteria)  On Site Evaluation by:   Reviewed with Physician:    Dorris Fetch Jancie Kercher 05/29/2015 2:21 PM

## 2015-05-29 NOTE — ED Notes (Signed)
Numbers from pts phone for pt use:  Jackie: [phone] Riki RuskJeremy: [phone] Ryan:[phone] Vernona RiegerLaura: [phone]

## 2015-05-29 NOTE — ED Notes (Signed)
Pt wanded by security in triage. 

## 2015-05-29 NOTE — Progress Notes (Signed)
T. Melvyn NethLewis, FNP, determined that Pt meets inpatient criteria on 4/7.  [facility]  CSW will continue to follow up with placement.  Traci [name], LCSWA Disposition staff 05/29/2015 6:59 PM

## 2015-05-30 ENCOUNTER — Inpatient Hospital Stay (HOSPITAL_COMMUNITY)
Admission: RE | Admit: 2015-05-30 | Discharge: 2015-06-02 | DRG: 897 | Disposition: A | Discharge status: Home / Self Care | Payer: Federal, State, Local not specified - Other | Attending: Psychiatry | Admitting: Psychiatry

## 2015-05-30 ENCOUNTER — Encounter (HOSPITAL_COMMUNITY): Payer: Self-pay | Admitting: Nurse Practitioner

## 2015-05-30 DIAGNOSIS — F1910 Other psychoactive substance abuse, uncomplicated: Secondary | ICD-10-CM | POA: Diagnosis present

## 2015-05-30 DIAGNOSIS — G4700 Insomnia, unspecified: Secondary | ICD-10-CM | POA: Diagnosis present

## 2015-05-30 DIAGNOSIS — G4710 Hypersomnia, unspecified: Secondary | ICD-10-CM | POA: Diagnosis present

## 2015-05-30 DIAGNOSIS — F4010 Social phobia, unspecified: Secondary | ICD-10-CM | POA: Diagnosis present

## 2015-05-30 DIAGNOSIS — F1114 Opioid abuse with opioid-induced mood disorder: Principal | ICD-10-CM | POA: Diagnosis present

## 2015-05-30 DIAGNOSIS — F17210 Nicotine dependence, cigarettes, uncomplicated: Secondary | ICD-10-CM | POA: Diagnosis present

## 2015-05-30 DIAGNOSIS — F331 Major depressive disorder, recurrent, moderate: Secondary | ICD-10-CM | POA: Diagnosis not present

## 2015-05-30 DIAGNOSIS — F1994 Other psychoactive substance use, unspecified with psychoactive substance-induced mood disorder: Secondary | ICD-10-CM

## 2015-05-30 DIAGNOSIS — F332 Major depressive disorder, recurrent severe without psychotic features: Secondary | ICD-10-CM | POA: Diagnosis present

## 2015-05-30 DIAGNOSIS — R45851 Suicidal ideations: Secondary | ICD-10-CM | POA: Diagnosis present

## 2015-05-30 DIAGNOSIS — F339 Major depressive disorder, recurrent, unspecified: Secondary | ICD-10-CM | POA: Diagnosis present

## 2015-05-30 MED ORDER — LORAZEPAM 1 MG PO TABS
1.0000 mg | ORAL_TABLET | Freq: Four times a day (QID) | ORAL | Status: DC | PRN
  Administered 2015-05-30 (×3): 1 mg via ORAL
  Filled 2015-05-30 (×3): qty 1

## 2015-05-30 MED ORDER — MAGNESIUM HYDROXIDE 400 MG/5ML PO SUSP: 30.0000 mL | Freq: Every day | ORAL | Status: DC | PRN

## 2015-05-30 MED ORDER — TRAZODONE HCL 50 MG PO TABS
50.0000 mg | ORAL_TABLET | Freq: Every evening | ORAL | Status: DC | PRN
  Administered 2015-05-30: 50 mg via ORAL
  Filled 2015-05-30: qty 1

## 2015-05-30 MED ORDER — NICOTINE 21 MG/24HR TD PT24
21.0000 mg | MEDICATED_PATCH | Freq: Every day | TRANSDERMAL | Status: DC
  Administered 2015-05-30 – 2015-06-02 (×4): 21 mg via TRANSDERMAL
  Filled 2015-05-30 (×5): qty 1

## 2015-05-30 MED ORDER — ACETAMINOPHEN 325 MG PO TABS
650.0000 mg | ORAL_TABLET | Freq: Four times a day (QID) | ORAL | Status: DC | PRN
  Administered 2015-05-30 – 2015-06-01 (×4): 650 mg via ORAL
  Filled 2015-05-30 (×4): qty 2

## 2015-05-30 MED ORDER — TRAZODONE HCL 100 MG PO TABS
100.0000 mg | ORAL_TABLET | Freq: Every evening | ORAL | Status: DC | PRN
  Administered 2015-05-30 – 2015-06-01 (×3): 100 mg via ORAL
  Filled 2015-05-30 (×2): qty 1
  Filled 2015-05-30: qty 14
  Filled 2015-05-30: qty 1

## 2015-05-30 MED ORDER — GABAPENTIN 100 MG PO CAPS
100.0000 mg | ORAL_CAPSULE | Freq: Three times a day (TID) | ORAL | Status: DC
  Administered 2015-05-30 – 2015-06-02 (×10): 100 mg via ORAL
  Filled 2015-05-30 (×2): qty 1
  Filled 2015-05-30: qty 21
  Filled 2015-05-30 (×2): qty 1
  Filled 2015-05-30: qty 21
  Filled 2015-05-30 (×3): qty 1
  Filled 2015-05-30: qty 21
  Filled 2015-05-30 (×6): qty 1

## 2015-05-30 MED ORDER — ALUM & MAG HYDROXIDE-SIMETH 200-200-20 MG/5ML PO SUSP: 30.0000 mL | ORAL | Status: DC | PRN

## 2015-05-30 MED ORDER — HYDROXYZINE HCL 50 MG PO TABS
50.0000 mg | ORAL_TABLET | Freq: Four times a day (QID) | ORAL | Status: DC | PRN
  Administered 2015-05-30 – 2015-06-02 (×7): 50 mg via ORAL
  Filled 2015-05-30 (×4): qty 1
  Filled 2015-05-30: qty 10
  Filled 2015-05-30 (×2): qty 1

## 2015-05-30 NOTE — Progress Notes (Signed)
Traci [name] is actively engaging with her peers on the unit. She states "today was better than I expected". [facility]. She rates Anxiety 0/10 and Depression 5/10.Denies SI/HI/AVH at this time. Contracts for safety. Encouragement and support given. Medications administered as prescribed. Continue to monitor Q 15 minutes for patient safety and medication effectiveness.

## 2015-05-30 NOTE — Progress Notes (Signed)
Patient up and visible in milieu. Anxious in affect with congruent mood. Rates her depression at a 5/10, hopelessness at a 4/10 and anxiety at an 8/10. Complaining of generalized achiness of a 5/10. Reports her goal is to "socialize without being anxious." Medicated per orders. Tylenol and ativan given prn with good results (2-3/10). Emotional support offered. Self inventory reviewed. Denies SI/HI and remains safe on level III obs.

## 2015-05-30 NOTE — Progress Notes (Signed)
Admit Note:  Traci ClientHannah has been oriented to the unit. She is here to get help with her long standing depression and substance abuse and addiction. [facility] the [facility]. Her plan is to return there upon discharge. She has not additional questions at his time. Will continue to monitor Q 15 minutes for patient safety.

## 2015-05-30 NOTE — H&P (Signed)
Psychiatric Admission Assessment Adult  Patient Identification: Traci [name] MRN:  [id number] Date of Evaluation:  05/30/2015 Chief Complaint:  MDD RECURRENT SEVERE SUBSTANCE INDUCED MOOD DISORDER Principal Diagnosis: <principal problem not specified> Diagnosis:   Patient Active Problem List   Diagnosis Date Noted  . Substance induced mood disorder [facility]) [F19.94] 05/30/2015   History of Present Illness:: 30 Y/O female who states she started using was kicked out of her half way house. 9 months last use of heroin. Was using Xanax alcohol cocaine. Went to treatmetn 66 day at [facility] got out sometime in [place] started using this drug 3 months ago when she had 6 months, in January. States it was advertasied it was all natural available in the stores. States she has a lot of social anxiety. A month in was taking every day if she did not have it she would think about it. States it became unmanageable but could not give it up. The 7 th was the first day off. States yesterday was feeling like a very mild opioid withdrawal. Has been increasingly more depressed, having thoughts of death.  The initial assessment was as follows: Traci [name] is a Caucasian 30 y.o. female who presented to [facility] voluntarily as a walk-in. She complained of suicidal ideation with plan and mild withdrawal symptoms from use of Kratom. 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 Over the last three months, she has self-medicated her anxiety with Kratom. She reported that two days ago, she abruptly stopped using Kratom and is now experiencing suicidal ideation with plan (overdose on heroin) and body aches. Client indicated that she has attempted suicide twice before -- once at age 30 (overdose on "some pills") and once in her 3s (not sure of the date), at which time she threatened to kill herself with a weapon. Pt stated that she is currently  suicidal and she is worried that if discharged, she would harm herself. In addition to suicidal ideation, Pt endorsed tearfulness, persistent and long-standing sadness, feelings of hopelessness, poor appetite, and insomnia. She reported first using opioids at age 29 ("pills") and also use of heroin. Last use of heroin was approximately nine months ago.  Associated Signs/Symptoms: Depression Symptoms:  depressed mood, anhedonia, hypersomnia, fatigue, feelings of worthlessness/guilt, recurrent thoughts of death, anxiety, disturbed sleep, decreased appetite, (Hypo) Manic Symptoms:  Labiality of Mood, Anxiety Symptoms:  Excessive Worry, Social Anxiety, Psychotic Symptoms:  denies PTSD Symptoms: Had a traumatic exposure:  has gone through things but does not relieve them Total Time spent with patient: 45 minutes  Past Psychiatric History:   Is the patient at risk to self? No.  Has the patient been a risk to self in the past 6 months? No.  Has the patient been a risk to self within the distant past? No.  Is the patient a risk to others? No.  Has the patient been a risk to others in the past 6 months? No.  Has the patient been a risk to others within the distant past? No.   Prior Inpatient Therapy: Prior Inpatient Therapy: Yes Prior Therapy Dates: Could not recall specific dates Prior Therapy Facilty/Provider(s): "[facility]" and [facility] Reason for Treatment: Suicide attempts Prior Outpatient Therapy: Prior Outpatient Therapy: Yes Prior Therapy Dates: 2016 Prior Therapy Facilty/Provider(s): [facility] Reason for Treatment: Substance use -- heroin Does patient have an ACCT team?: No Does patient have Intensive In-House Services?  : No  Does patient have [facility] services? : No Does patient have P4CC services?: No At [address] [place] in her 30's last year [facility] days Alcohol Screening: 1. How often do you have a drink containing alcohol?: Monthly or less 2. How  many drinks containing alcohol do you have on a typical day when you are drinking?: 1 or 2 3. How often do you have six or more drinks on one occasion?: Never Preliminary Score: 0 4. How often during the last year have you found that you were not able to stop drinking once you had started?: Never 5. How often during the last year have you failed to do what was normally expected from you becasue of drinking?: Never 6. How often during the last year have you needed a first drink in the morning to get yourself going after a heavy drinking session?: Never 7. How often during the last year have you had a feeling of guilt of remorse after drinking?: Never 8. How often during the last year have you been unable to remember what happened the night before because you had been drinking?: Never 9. Have you or someone else been injured as a result of your drinking?: No 10. Has a relative or friend or a doctor or another health worker been concerned about your drinking or suggested you cut down?: No Alcohol Use Disorder Identification Test Final Score (AUDIT): 1 Brief Intervention: AUDIT score less than 7 or less-screening does not suggest unhealthy drinking-brief intervention not indicated Substance Abuse History in the last 12 months:  Yes.   Consequences of Substance Abuse: Withdrawal Symptoms:   Cramps Diaphoresis back pain anorexia  Drug related charge dismissed  Previous Psychotropic Medications: Yes Prozac Wellbutrin Zoloft Paxil Lexapro Cymbalta Tegretol Depakote Neurontin Seroquel  Psychological Evaluations: No  Past Medical History:  Past Medical History  Diagnosis Date  . ADD (attention deficit disorder)   . Depression   . Substance abuse    History reviewed. No pertinent past surgical history. Family History: History reviewed. No pertinent family history. Family Psychiatric  History:Depression and anxiety in her family, alcohol drugs Tobacco Screening: '@FLOW'$ ([phone])::1)@ Social  History:  History  Alcohol Use No     History  Drug Use  . Yes  . 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 Zinc Oxide Itching and Swelling   Lab Results:  Results for orders placed or performed during the hospital encounter of 05/29/15 (from the past 48 hour(s))  I-Stat Beta hCG blood, ED ([facility], [facility], [facility] only)     Status: None   Collection Time: 05/29/15  3:04 PM  Result Value Ref Range   I-stat hCG, quantitative <5.0 <5 mIU/mL   Comment 3  Comment:   GEST. AGE      CONC.  (mIU/mL)   <=1 WEEK        5 - 50     2 WEEKS       50 - 500     3 WEEKS       100 - 10,000     4 WEEKS     1,000 - 30,000        FEMALE AND NON-PREGNANT FEMALE:     LESS THAN 5 mIU/mL   Comprehensive metabolic panel     Status: Abnormal   Collection Time: 05/29/15  3:09 PM  Result Value Ref Range   Sodium 139 135 - 145 mmol/L   Potassium 4.9 3.5 - 5.1 mmol/L   Chloride 108 101 - 111 mmol/L   CO2 21 (L) 22 - 32 mmol/L   Glucose, Bld 105 (H) 65 - 99 mg/dL   BUN 10 6 - 20 mg/dL   Creatinine, Ser 0.82 0.44 - 1.00 mg/dL   Calcium 10.1 8.9 - 10.3 mg/dL   Total Protein 7.6 6.5 - 8.1 g/dL   Albumin 4.4 3.5 - 5.0 g/dL   AST 26 15 - 41 U/L   ALT 29 14 - 54 U/L   Alkaline Phosphatase 100 38 - 126 U/L   Total Bilirubin 0.7 0.3 - 1.2 mg/dL   GFR calc non Af Amer >60 >60 mL/min   GFR calc Af  Amer >60 >60 mL/min    Comment: (NOTE) The eGFR has been calculated using the CKD EPI equation. This calculation has not been validated in all clinical situations. eGFR's persistently <60 mL/min signify possible Chronic Kidney Disease.    Anion gap 10 5 - 15  Ethanol (ETOH)     Status: None   Collection Time: 05/29/15  3:09 PM  Result Value Ref Range   Alcohol, Ethyl (B) <5 <5 mg/dL    Comment:        LOWEST DETECTABLE LIMIT FOR SERUM ALCOHOL IS 5 mg/dL FOR MEDICAL PURPOSES ONLY   CBC     Status: Abnormal   Collection Time: 05/29/15  3:09 PM  Result Value Ref Range   WBC 12.8 (H) 4.0 - 10.5 K/uL   RBC 4.87 3.87 - 5.11 MIL/uL   Hemoglobin 15.0 12.0 - 15.0 g/dL   HCT 44.1 36.0 - 46.0 %   MCV 90.6 78.0 - 100.0 fL   MCH 30.8 26.0 - 34.0 pg   MCHC 34.0 30.0 - 36.0 g/dL   RDW 12.5 11.5 - 15.5 %   Platelets 313 150 - 400 K/uL  Urine rapid drug screen (hosp performed) (Not at [facility])     Status: None   Collection Time: 05/29/15  3:27 PM  Result Value Ref Range   Opiates NONE DETECTED NONE DETECTED   Cocaine NONE DETECTED NONE DETECTED   Benzodiazepines NONE DETECTED NONE DETECTED   Amphetamines NONE DETECTED NONE DETECTED   Tetrahydrocannabinol NONE DETECTED NONE DETECTED   Barbiturates NONE DETECTED NONE DETECTED    Comment:        DRUG SCREEN FOR MEDICAL PURPOSES ONLY.  IF CONFIRMATION IS NEEDED FOR ANY PURPOSE, NOTIFY LAB WITHIN 5 DAYS.        LOWEST DETECTABLE LIMITS FOR URINE DRUG SCREEN Drug Class       Cutoff (ng/mL) Amphetamine      1000 Barbiturate      200 Benzodiazepine   923 Tricyclics       300 Opiates  300 Cocaine          300 THC              50     Blood Alcohol level:  Lab Results  Component Value Date   ETH <5 05/29/2015   [facility]  12/19/2008    <5        LOWEST DETECTABLE LIMIT FOR SERUM ALCOHOL IS 5 mg/dL FOR MEDICAL PURPOSES ONLY    Metabolic Disorder Labs:  No results found for: HGBA1C, MPG No results found for: PROLACTIN No  results found for: CHOL, TRIG, HDL, CHOLHDL, VLDL, LDLCALC  Current Medications: Current Facility-Administered Medications  Medication Dose Route Frequency Provider Last Rate Last Dose  . acetaminophen (TYLENOL) tablet 650 mg  650 mg Oral Q6H PRN Nicholaus Bloom, MD   650 mg at 05/30/15 1010  . alum & mag hydroxide-simeth (MAALOX/MYLANTA) 200-200-20 MG/5ML suspension 30 mL  30 mL Oral Q4H PRN Nicholaus Bloom, MD      . LORazepam (ATIVAN) tablet 1 mg  1 mg Oral Q6H PRN Nicholaus Bloom, MD   1 mg at 05/30/15 0154  . magnesium hydroxide (MILK OF MAGNESIA) suspension 30 mL  30 mL Oral Daily PRN Nicholaus Bloom, MD      . nicotine (NICODERM CQ - dosed in mg/24 hours) patch 21 mg  21 mg Transdermal Daily Nicholaus Bloom, MD   21 mg at 05/30/15 1010  . traZODone (DESYREL) tablet 50 mg  50 mg Oral QHS PRN,MR X 1 Nicholaus Bloom, MD   50 mg at 05/30/15 0154   PTA Medications: Prescriptions prior to admission  Medication Sig Dispense Refill Last Dose  . acetaminophen (TYLENOL) 325 MG tablet Take 650 mg by mouth every 6 (six) hours as needed for moderate pain.   Past Month at Unknown time  . Ascorbic Acid (VITAMIN C PO) Take 1 tablet by mouth daily as needed (FOR IMMUNE SUPPORT).   05/29/2015 at Unknown time  . Ibuprofen (MIDOL) 200 MG CAPS Take 200 mg by mouth daily as needed (FOR CRAMPS).   Past Month at Unknown time    Musculoskeletal: Strength & Muscle Tone: within normal limits Gait & Station: normal Patient leans: normal  Psychiatric Specialty Exam: Physical Exam  Review of Systems  Constitutional: Positive for malaise/fatigue.  HENT:       Bilateral headaches  Eyes: Positive for blurred vision.  Respiratory: Positive for cough.        Pack a day  Cardiovascular: Negative.   Gastrointestinal: Negative.   Genitourinary: Negative.   Musculoskeletal: Positive for back pain and neck pain.  Skin: Negative.   Neurological: Positive for headaches.  Endo/Heme/Allergies: Negative.    Psychiatric/Behavioral: Positive for depression and substance abuse. The patient is nervous/anxious and has insomnia.     Blood pressure 120/82, pulse 82, temperature 98.2 F (36.8 C), temperature source Oral, resp. rate 20, height '5\' 4"'$  (1.626 m), weight 73.936 kg (163 lb), last menstrual period 05/08/2015.Body mass index is 27.97 kg/(m^2).  General Appearance: Fairly Groomed  Engineer, water::  Fair  Speech:  Clear and Coherent  Volume:  fluctuates  Mood:  Anxious, Depressed and Dysphoric  Affect:  Restricted  Thought Process:  Coherent and Goal Directed  Orientation:  Full (Time, Place, and Person)  Thought Content:  symptoms events worries concerns  Suicidal Thoughts:  No  Homicidal Thoughts:  No  Memory:  Immediate;   Fair Recent;   Fair Remote;   Fair  Judgement:  Fair  Insight:  Present  Psychomotor Activity:  Restlessness  Concentration:  Fair  Recall:  [employer] of Knowledge:Fair  Language: Fair  Akathisia:  No  Handed:  Right  AIMS (if indicated):     Assets:  Desire for Improvement Social Support  ADL's:  Intact  Cognition: WNL  Sleep:  Number of Hours: 2.5     Treatment Plan Summary: Daily contact with patient to assess and evaluate symptoms and progress in treatment and Medication management Supportive approach/coping skills Substance abuse; detox as needed work a relapse prevention plan Anxiety; Neurontin 100 mg TID/Vistaril 50 mg  Insomnia; Trazodone 100 mg HS Mood instability; reassess for a mood stabilizer (Has used Tegretol Depakote Seroquel Neurontin among other antidepressants) Work with CBT/mindfulness Observation Level/Precautions:  15 minute checks  Laboratory:  As per the ED  Psychotherapy:  Individual/group  Medications:  Ativan on a PRN basis/Neurontin 100 mg TID  Consultations:    Discharge Concerns:    Estimated LOS: 3-5 days  Other:     I certify that inpatient services furnished can reasonably be expected to improve the patient's  condition.    Nicholaus Bloom, MD 4/8/201710:38 AM

## 2015-05-30 NOTE — Tx Team (Signed)
Initial Interdisciplinary Treatment Plan   PATIENT STRESSORS: Financial difficulties Medication change or noncompliance Substance abuse   PATIENT STRENGTHS: Ability for insight Average or above average intelligence Communication skills General fund of knowledge Motivation for treatment/growth Supportive family/friends   PROBLEM LIST: Problem List/Patient Goals Date to be addressed Date deferred Reason deferred Estimated date of resolution  "get on an antidepressant" 05-29-2015     "to get a clearer mind" 05-29-2015     Depression  05-29-2015     Substance Abuse 05-29-2015     Suicidal Ideation  05-29-2015                              DISCHARGE CRITERIA:  Ability to meet basic life and health needs Improved stabilization in mood, thinking, and/or behavior Reduction of life-threatening or endangering symptoms to within safe limits Safe-care adequate arrangements made  PRELIMINARY DISCHARGE PLAN: Attend PHP/IOP Attend 12-step recovery group Outpatient therapy  PATIENT/FAMIILY INVOLVEMENT: This treatment plan has been presented to and reviewed with the patient, Traci [name].  The patient and family have been given the opportunity to ask questions and make suggestions.  Claiborne RiggZelda W Fleming 05/30/2015, 4:30 AM

## 2015-05-30 NOTE — [facility] Group Notes (Signed)
[facility] Group Notes:  (Clinical Social Work)   12/20/2014     10:00-11:00AM  Summary of Progress/Problems:   In today's process group a decisional balance exercise was used to explore in depth the perceived benefits and costs of alcohol and drugs, as well as the  benefits and costs of replacing these with healthy coping skills.  Patients listed healthy and unhealthy coping techniques, determining with CSW guidance that unhealthy coping techniques work initially, but eventually become harmful.  Motivational Interviewing and the whiteboard were utilized for the exercises.  The patient was not present at the beginning of group, and seemed uncomfortable when exposed to another patient's disruptive behavior.  She spoke very little during group, but did seem interested.  Type of Therapy:  Group Therapy - Process   Participation Level:  Active  Participation Quality:  Attentive  Affect:  Anxious and Blunted  Cognitive:  Oriented  Insight:  Developing/Improving  Engagement in Therapy:  Engaged  Modes of Intervention:  Education, Motivational Interviewing  Ambrose MantleMareida Grossman-Orr, LCSW 05/30/2015, 12:34 PM

## 2015-05-30 NOTE — [facility] Suicide Risk Assessment (Signed)
[facility]   Nursing information obtained from:  Patient Demographic factors:  Caucasian, Low socioeconomic status Current Mental Status:  NA Loss Factors:  Financial problems / change in socioeconomic status Historical Factors:  Prior suicide attempts Risk Reduction Factors:  Positive social support  Total Time spent with patient: 45 minutes Principal Problem: Substance induced mood disorder (HCC) Diagnosis:   Patient Active Problem List   Diagnosis Date Noted  . Substance induced mood disorder (HCC) [F19.94] 05/30/2015  . Major depression, recurrent (HCC) [F33.9] 05/30/2015  . Polysubstance abuse [F19.10] 05/30/2015   Subjective Data: see admission H and P  Continued Clinical Symptoms:  Alcohol Use Disorder Identification Test Final Score (AUDIT): 1 The "Alcohol Use Disorders Identification Test", Guidelines for Use in Primary Care, Second Edition.  World Science writerHealth Organization [facility](WHO). Score between 0-7:  no or low risk or alcohol related problems. Score between 8-15:  moderate risk of alcohol related problems. Score between 16-19:  high risk of alcohol related problems. Score 20 or above:  warrants further diagnostic evaluation for alcohol dependence and treatment.   CLINICAL FACTORS:   Severe Anxiety and/or Agitation Depression:   Comorbid alcohol abuse/dependence Alcohol/Substance Abuse/Dependencies  Psychiatric Specialty Exam: ROS  Blood pressure 120/82, pulse 82, temperature 98.2 F (36.8 C), temperature source Oral, resp. rate 20, height 5\' 4"  (1.626 m), weight 73.936 kg (163 lb), last menstrual period 05/08/2015.Body mass index is 27.97 kg/(m^2).   COGNITIVE FEATURES THAT CONTRIBUTE TO RISK:  Closed-mindedness, Polarized thinking and Thought constriction (tunnel vision)    SUICIDE RISK:   Moderate:  Frequent suicidal ideation with limited intensity, and duration, some specificity in terms of plans, no associated intent, good self-control,  limited dysphoria/symptomatology, some risk factors present, and identifiable protective factors, including available and accessible social support.  PLAN OF CARE: see admission H and P  I certify that inpatient services furnished can reasonably be expected to improve the patient's condition.   Rachael FeeLUGO,Maisley Hainsworth A, MD 05/30/2015, 6:23 PM

## 2015-05-30 NOTE — Progress Notes (Signed)
[facility] Group Notes:  (Nursing/MHT/Case Management/Adjunct)  Date:  05/30/2015  Time:  2100  Type of Therapy:  wrap up group  Participation Level:  Active  Participation Quality:  Appropriate, Attentive, Sharing and Supportive  Affect:  Flat  Cognitive:  Appropriate  Insight:  Improving  Engagement in Group:  Engaged  Modes of Intervention:  Clarification, Education and Support  Summary of Progress/Problems: Pt shared that her friends in her [facility] [facility] encouraged her to come in because she was abusing something that the patient reports not being illegal but she knew that it would lead to her using her drug of choice. Pt plans on returning to the [place] [facility] and to her NA meetings.   Marcille BuffyMcNeil, Jaia Alonge S 05/30/2015, 10:19 PM

## 2015-05-31 NOTE — Plan of Care (Signed)
Problem: Ineffective individual coping Goal: STG: Patient will remain free from self harm Outcome: Progressing Patient has not engaged in self harm.  Problem: Alteration in mood & ability to function due to Goal: STG-Patient will comply with prescribed medication regimen (Patient will comply with prescribed medication regimen)  Outcome: Progressing Patient has been med compliant.

## 2015-05-31 NOTE — [facility] Group Notes (Signed)
[facility] Group Notes: (Clinical Social Work)   05/31/2015      Type of Therapy:  Group Therapy   Participation Level:  Did Not Attend despite MHT prompting   Glori Machnik Grossman-Orr, LCSW 05/31/2015, 11:11 AM     

## 2015-05-31 NOTE — Progress Notes (Signed)
[facility] Progress Note  05/31/2015 9:18 PM Traci DieterHannah [name]  MRN:  [id number] Subjective:  Traci ClientHannah states she is starting to feel like her old self. He states she is eager to [facility] back [facility]. States she plans to call the house today and see when can she go back Principal Problem: Substance induced mood disorder (HCC) Diagnosis:   Patient Active Problem List   Diagnosis Date Noted  . Substance induced mood disorder (HCC) [F19.94] 05/30/2015  . Major depression, recurrent (HCC) [F33.9] 05/30/2015  . Polysubstance abuse [F19.10] 05/30/2015   Total Time spent with patient: 15 minutes  Past Psychiatric History: see admission H and P  Past Medical History:  Past Medical History  Diagnosis Date  . ADD (attention deficit disorder)   . Depression   . Substance abuse    History reviewed. No pertinent past surgical history. Family History: History reviewed. No pertinent family history. Family Psychiatric  History: see admission H and P Social History:  History  Alcohol Use No     History  Drug Use  . Yes  . Special: Heroin    Social History   Social History  . Marital Status: Single    Spouse Name: N/A  . Number of Children: N/A  . Years of Education: N/A   Social History Main Topics  . Smoking status: Current Every Day Smoker -- 1.00 packs/day for 10 years    Types: Cigarettes  . Smokeless tobacco: None  . Alcohol Use: No  . Drug Use: Yes    Special: Heroin  . Sexual Activity: Not Currently    Birth Control/ Protection: IUD   Other Topics Concern  . None   Social History Narrative   Additional Social History:    Pain Medications: See PTA Prescriptions: See PTA Over the Counter: See PTA History of alcohol / drug use?: Yes Name of Substance 1: Heroin/Opioids 1 - Age of First Use: 20 1 - Amount (size/oz): Varied 1 - Frequency: Episodic 1 - Duration: 8 years 1 - Last Use / Amount: Approximately July 2016 Name of Substance 2: Kratom 2 - Age of First Use:  [age] 2 - Amount (size/oz): Varied 2 - Frequency: Daily 2 - Duration: Ongoing 2 - Last Use / Amount: 05/27/15                Sleep: Fair  Appetite:  Fair  Current Medications: Current Facility-Administered Medications  Medication Dose Route Frequency Provider Last Rate Last Dose  . acetaminophen (TYLENOL) tablet 650 mg  650 mg Oral Q6H PRN Rachael FeeIrving A Amaryllis Malmquist, MD   650 mg at 05/31/15 1646  . alum & mag hydroxide-simeth (MAALOX/MYLANTA) 200-200-20 MG/5ML suspension 30 mL  30 mL Oral Q4H PRN Rachael FeeIrving A Yusif Gnau, MD      . gabapentin (NEURONTIN) capsule 100 mg  100 mg Oral TID Rachael FeeIrving A Lucan Riner, MD   100 mg at 05/31/15 1645  . hydrOXYzine (ATARAX/VISTARIL) tablet 50 mg  50 mg Oral Q6H PRN Rachael FeeIrving A Hosey Burmester, MD   50 mg at 05/31/15 2100  . LORazepam (ATIVAN) tablet 1 mg  1 mg Oral Q6H PRN Rachael FeeIrving A Maya Scholer, MD   1 mg at 05/30/15 1836  . magnesium hydroxide (MILK OF MAGNESIA) suspension 30 mL  30 mL Oral Daily PRN Rachael FeeIrving A Emira Eubanks, MD      . nicotine (NICODERM CQ - dosed in mg/24 hours) patch 21 mg  21 mg Transdermal Daily Rachael FeeIrving A Lemario Chaikin, MD   21 mg at 05/31/15 0743  .  traZODone (DESYREL) tablet 100 mg  100 mg Oral QHS PRN,MR X 1 Rachael Fee, MD   100 mg at 05/31/15 2100    Lab Results: No results found for this or any previous visit (from the past 48 hour(s)).  Blood Alcohol level:  Lab Results  Component Value Date   ETH <5 05/29/2015   [facility]  12/19/2008    <5        LOWEST DETECTABLE LIMIT FOR SERUM ALCOHOL IS 5 mg/dL FOR MEDICAL PURPOSES ONLY    Physical Findings: AIMS: Facial and Oral Movements Muscles of Facial Expression: None, normal Lips and Perioral Area: None, normal Jaw: None, normal Tongue: None, normal,Extremity Movements Upper (arms, wrists, hands, fingers): None, normal Lower (legs, knees, ankles, toes): None, normal, Trunk Movements Neck, shoulders, hips: None, normal, Overall Severity Severity of abnormal movements (highest score from questions above): None, normal Incapacitation due  to abnormal movements: None, normal Patient's awareness of abnormal movements (rate only patient's report): No Awareness, Dental Status Current problems with teeth and/or dentures?: No Does patient usually wear dentures?: No  CIWA:  CIWA-Ar Total: 1 COWS:  COWS Total Score: 0  Musculoskeletal: Strength & Muscle Tone: within normal limits Gait & Station: normal Patient leans: normal  Psychiatric Specialty Exam: Review of Systems  Constitutional: Negative.   HENT: Negative.   Eyes: Negative.   Respiratory: Negative.   Cardiovascular: Negative.   Gastrointestinal: Negative.   Genitourinary: Negative.   Musculoskeletal: Negative.   Skin: Negative.   Neurological: Negative.   Endo/Heme/Allergies: Negative.   Psychiatric/Behavioral: Positive for depression and substance abuse. The patient is nervous/anxious.     Blood pressure 138/107, pulse 104, temperature 98.5 F (36.9 C), temperature source Oral, resp. rate 16, height  (1.626 m), weight 73.936 kg (163 lb), last menstrual period 05/08/2015.Body mass index is 27.97 kg/(m^2).  General Appearance: Fairly Groomed  Patent attorney::  Fair  Speech:  Clear and Coherent  Volume:  Normal  Mood:  Euthymic  Affect:  Appropriate  Thought Process:  Coherent and Goal Directed  Orientation:  Full (Time, Place, and Person)  Thought Content:  plans as she moves on, relapse preventino plan  Suicidal Thoughts:  No  Homicidal Thoughts:  No  Memory:  Immediate;   Fair Recent;   Fair Remote;   Fair  Judgement:  Fair  Insight:  Present  Psychomotor Activity:  Restlessness  Concentration:  Fair  Recall:  [employer] of Knowledge:Fair  Language: Fair  Akathisia:  No  Handed:  Right  AIMS (if indicated):     Assets:  Desire for Improvement Social Support  ADL's:  Intact  Cognition: WNL  Sleep:  Number of Hours: 6.25   Treatment Plan Summary: Daily contact with patient to assess and evaluate symptoms and progress in treatment and  Medication management Supportive approach/coping skills Polysubstance abuse; work a relapse prevention plan Mood instability continue to work with Neurontin 100 mg (off label) Insomnia; continue to work with the Trazodone 100  Explore placement options if she was not able to go back to the house in few days Work with CBT/mindfulness Cabria Micalizzi A, MD 05/31/2015, 9:18 PM

## 2015-05-31 NOTE — [facility] Suicide Risk Assessment (Signed)
[facility] INPATIENT:  Family/Significant Other Suicide Prevention Education  Suicide Prevention Education:  Patient Refusal for Family/Significant Other Suicide Prevention Education: The patient Traci [name] has refused to provide written consent for family/significant other to be provided Family/Significant Other Suicide Prevention Education during admission and/or prior to discharge.  Physician notified.  SPE brochure given to patient and reviewed.  [name], [name] [name] 05/31/2015, 2:20 PM

## 2015-05-31 NOTE — [facility] Group Notes (Signed)
[facility] Group Notes:  (Nursing/MHT/Case Management/Adjunct)  Date:  05/31/2015  Time:  1415pm  Type of Therapy:  Group completed by nursing students  Participation Level:  Active  Participation Quality:  Appropriate and Attentive  Affect:  Appropriate  Cognitive:  Alert and Appropriate  Insight:  Appropriate  Engagement in Group:  Engaged  Modes of Intervention:  Activity  Summary of Progress/Problems: Patient attended group and participated.  Jonathon BellowsBrittany G Kristiann Noyce 05/31/2015, 4:48 PM

## 2015-05-31 NOTE — Progress Notes (Signed)
Patient has been isolative to room, bed much of the day however is presently in the dayroom. Has been requesting vistaril for her anxiety. States she is trying to take it rather than the ativan. Does report it is making her sleepy however. She rates her depression and anxiety both at a 5/10, hopelessness at a 4/10. Reports her goal is to "become more social" and to be "talk to people." Affect anxious with congruent mood. Patient pleasant and cooperative. Medicated per orders, vistaril given twice thus far today. Emotional support offered. Self inventory reviewed. She denies SI/HI and remains safe on level III obs.
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
## 2015-06-01 NOTE — Progress Notes (Signed)
Pt reports she had a good day.  She denies SI/HI/AVH.  She attended evening group tonight.  She still wants to just take the Vistaril when she needs something for anxiety instead of the Ativan.  She voices no needs or concerns at this time.  Pt is pleasant and cooperative with staff.  Discharge plans are in process.  Safety maintained with q15 minute checks.

## 2015-06-01 NOTE — Progress Notes (Signed)
Patient ID: Traci [name], female   DOB: 27-May-1985, 30 y.o.   MRN: [id number] [facility] MD Progress Note  06/01/2015 6:33 PM Traci [name]  MRN:  [id number] Subjective:  [name] states she is starting to feel like her old self. He states she is eager to go back to the [facility]. They are going to allow her to go back tomorrow. They will be picking her up. States she knows what she needs to do not to relapse. States she has been living at the [facility] for a while so she has a good relationship with the other residents Principal Problem: Substance induced mood disorder (HCC) Diagnosis:   Patient Active Problem List   Diagnosis Date Noted  . Substance induced mood disorder (HCC) [F19.94] 05/30/2015  . Major depression, recurrent (HCC) [F33.9] 05/30/2015  . Polysubstance abuse [F19.10] 05/30/2015   Total Time spent with patient: 15 minutes  Past Psychiatric History: see admission H and P  Past Medical History:  Past Medical History  Diagnosis Date  . ADD (attention deficit disorder)   . Depression   . Substance abuse    History reviewed. No pertinent past surgical history. Family History: History reviewed. No pertinent family history. Family Psychiatric  History: see admission H and P Social History:  History  Alcohol Use No     History  Drug Use  . Yes  . Special: Heroin    Social History   Social History  . Marital Status: Single    Spouse Name: N/A  . Number of Children: N/A  . Years of Education: N/A   Social History Main Topics  . Smoking status: Current Every Day Smoker -- 1.00 packs/day for 10 years    Types: Cigarettes  . Smokeless tobacco: None  . Alcohol Use: No  . Drug Use: Yes    Special: Heroin  . Sexual Activity: Not Currently    Birth Control/ Protection: IUD   Other Topics Concern  . None   Social History Narrative   Additional Social History:    Pain Medications: See PTA Prescriptions: See PTA Over the Counter: See PTA History of alcohol / drug  use?: Yes Name of Substance 1: Heroin/Opioids 1 - Age of First Use: 20 1 - Amount (size/oz): Varied 1 - Frequency: Episodic 1 - Duration: 8 years 1 - Last Use / Amount: Approximately July 2016 Name of Substance 2: Kratom 2 - Age of First Use: 65 2 - Amount (size/oz): Varied 2 - Frequency: Daily 2 - Duration: Ongoing 2 - Last Use / Amount: 05/27/15                Sleep: Fair  Appetite:  Fair  Current Medications: Current Facility-Administered Medications  Medication Dose Route Frequency Provider Last Rate Last Dose  . acetaminophen (TYLENOL) tablet 650 mg  650 mg Oral Q6H PRN Rachael Fee, MD   650 mg at 06/01/15 1618  . alum & mag hydroxide-simeth (MAALOX/MYLANTA) 200-200-20 MG/5ML suspension 30 mL  30 mL Oral Q4H PRN Rachael Fee, MD      . gabapentin (NEURONTIN) capsule 100 mg  100 mg Oral TID Rachael Fee, MD   100 mg at 06/01/15 1618  . hydrOXYzine (ATARAX/VISTARIL) tablet 50 mg  50 mg Oral Q6H PRN Rachael Fee, MD   50 mg at 06/01/15 1827  . LORazepam (ATIVAN) tablet 1 mg  1 mg Oral Q6H PRN Rachael Fee, MD   1 mg at 05/30/15 1836  . magnesium hydroxide (MILK  OF MAGNESIA) suspension 30 mL  30 mL Oral Daily PRN Rachael FeeIrving A Djuana Littleton, MD      . nicotine (NICODERM CQ - dosed in mg/24 hours) patch 21 mg  21 mg Transdermal Daily Rachael FeeIrving A Ramy Greth, MD   21 mg at 06/01/15 0818  . traZODone (DESYREL) tablet 100 mg  100 mg Oral QHS PRN,MR X 1 Rachael FeeIrving A Maeryn Mcgath, MD   100 mg at 05/31/15 2100    Lab Results: No results found for this or any previous visit (from the past 48 hour(s)).  Blood Alcohol level:  Lab Results  Component Value Date   ETH <5 05/29/2015   [facility]  12/19/2008    <5        LOWEST DETECTABLE LIMIT FOR SERUM ALCOHOL IS 5 mg/dL FOR MEDICAL PURPOSES ONLY    Physical Findings: AIMS: Facial and Oral Movements Muscles of Facial Expression: None, normal Lips and Perioral Area: None, normal Jaw: None, normal Tongue: None, normal,Extremity Movements Upper (arms, wrists,  hands, fingers): None, normal Lower (legs, knees, ankles, toes): None, normal, Trunk Movements Neck, shoulders, hips: None, normal, Overall Severity Severity of abnormal movements (highest score from questions above): None, normal Incapacitation due to abnormal movements: None, normal Patient's awareness of abnormal movements (rate only patient's report): No Awareness, Dental Status Current problems with teeth and/or dentures?: No Does patient usually wear dentures?: No  CIWA:  CIWA-Ar Total: 1 COWS:  COWS Total Score: 0  Musculoskeletal: Strength & Muscle Tone: within normal limits Gait & Station: normal Patient leans: normal  Psychiatric Specialty Exam: Review of Systems  Constitutional: Negative.   HENT: Negative.   Eyes: Negative.   Respiratory: Negative.   Cardiovascular: Negative.   Gastrointestinal: Negative.   Genitourinary: Negative.   Musculoskeletal: Negative.   Skin: Negative.   Neurological: Negative.   Endo/Heme/Allergies: Negative.   Psychiatric/Behavioral: Positive for depression and substance abuse. The patient is nervous/anxious.     Blood pressure 98/59, pulse 79, temperature 98 F (36.7 C), temperature source Oral, resp. rate 16, height 5\' 4"  (1.626 m), weight 73.936 kg (163 lb), last menstrual period 05/08/2015.Body mass index is 27.97 kg/(m^2).  General Appearance: Fairly Groomed  Patent attorneyye Contact::  Fair  Speech:  Clear and Coherent  Volume:  Normal  Mood:  Euthymic  Affect:  Appropriate  Thought Process:  Coherent and Goal Directed  Orientation:  Full (Time, Place, and Person)  Thought Content:  plans as she moves on, relapse preventino plan  Suicidal Thoughts:  No  Homicidal Thoughts:  No  Memory:  Immediate;   Fair Recent;   Fair Remote;   Fair  Judgement:  Fair  Insight:  Present  Psychomotor Activity:  Restlessness  Concentration:  Fair  Recall:  [employer]is[employer]Fair  Fund of Knowledge:Fair  Language: Fair  Akathisia:  No  Handed:  Right  AIMS (if  indicated):     Assets:  Desire for Improvement Social Support  ADL's:  Intact  Cognition: WNL  Sleep:  Number of Hours: 6.75   Treatment Plan Summary: Daily contact with patient to assess and evaluate symptoms and progress in treatment and Medication management Supportive approach/coping skills Polysubstance abuse; work a relapse prevention plan Mood instability continue to work with Neurontin 100 mg (off label) Insomnia; continue to work with the Trazodone 100  [facility]  Work with CBT/mindfulness Tiyana Galla A, MD 06/01/2015, 6:33 PM

## 2015-06-01 NOTE — Progress Notes (Signed)
Patient ID: Traci [name], female   DOB: 03/13/1985, 30 y.o.   MRN: [id number]  DAR: Pt. Denies SI/HI and A/V Hallucinations. She reports sleep is fair, appetite is fair, energy level is normal, and concentration is good. She rates depression 4/10, hopelessness 4/10, and anxiety 6/10. Patient does report some generalized body aches and received PRN Tylenol for this. Support and encouragement provided to the patient. PRN Vistaril administered to patient which provided some relief from anxiety patient reports she is experiencing. Scheduled medications administered to patient per physician's orders. Patient is cooperative but remains minimal with this Clinical research associatewriter. She is seen in the milieu interacting with peers and is attending groups. Q15 minute checks are maintained for safety.

## 2015-06-01 NOTE — [facility] Group Notes (Signed)
[facility] LCSW Aftercare Discharge Planning Group Note  06/01/2015  8:45 AM  Participation Quality: Did Not Attend. Patient invited to participate but declined.  Harlem Bula, MSW, LCSW Clinical Social Worker [facility] [phone]   

## 2015-06-01 NOTE — [facility] Group Notes (Signed)
[facility] LCSW Group Therapy  06/01/2015   1:15 PM   Type of Therapy:  Group Therapy  Participation Level:  Minimal  Participation Quality:  Attentive  Affect:  Appropriate  Cognitive:  Alert and Oriented  Insight:  Developing/Improving and Engaged  Engagement in Therapy:  Developing/Improving and Engaged  Modes of Intervention:  Clarification, Confrontation, Discussion, Education, Exploration, Limit-setting, Orientation, Problem-solving, Rapport Building, Reality Testing, Socialization and Support  Summary of Progress/Problems: The topic for group therapy was feelings about diagnosis.  Pt actively participated in group discussion on their past and current diagnosis and how they feel towards this.  Pt also identified how society and family members judge them, based on their diagnosis as well as stereotypes and stigmas.  Patient participated minimally in discussion despite CSW encouragement.  Jesse Nosbisch, MSW, LCSW Clinical Social Worker [facility] [phone]   

## 2015-06-01 NOTE — Progress Notes (Addendum)
Adult Psychoeducational Group Note  Date:  06/01/2015 Time:  10:24 PM  Group Topic/Focus:  Wrap-Up Group:   The focus of this group is to help patients review their daily goal of treatment and discuss progress on daily workbooks.  Participation Level:  Active  Participation Quality:  Appropriate  Affect:  Appropriate  Cognitive:  Alert  Insight: Appropriate  Engagement in Group:  Engaged  Modes of Intervention:  Discussion  Additional Comments:  Patient stated having a good day. Patient goal for today was to sleep less. Patient met goal.   Brannon Decaire L Berkley Wrightsman 06/01/2015, 10:24 PM

## 2015-06-01 NOTE — Progress Notes (Signed)
Recreation Therapy Notes  Date: 04.10.2017 Time: 9:30am Location: 300 [address] Group Room   Group Topic: Stress Management  Goal Area(s) Addresses:  Patient will actively participate in stress management techniques presented during session.   Behavioral Response: Did not attend.   Eletha Culbertson L Azekiel Cremer, LRT/CTRS        Myron Lona L 06/01/2015 1:49 PM 

## 2015-06-02 DIAGNOSIS — F331 Major depressive disorder, recurrent, moderate: Secondary | ICD-10-CM | POA: Insufficient documentation

## 2015-06-02 MED ORDER — ACETAMINOPHEN 325 MG PO TABS: 650.0000 mg | ORAL_TABLET | Freq: Four times a day (QID) | ORAL | Status: DC | PRN

## 2015-06-02 MED ORDER — HYDROXYZINE HCL 50 MG PO TABS: 50.0000 mg | ORAL_TABLET | Freq: Four times a day (QID) | ORAL | Status: DC | PRN

## 2015-06-02 MED ORDER — NICOTINE 21 MG/24HR TD PT24: 21.0000 mg | MEDICATED_PATCH | Freq: Every day | TRANSDERMAL | Status: DC

## 2015-06-02 MED ORDER — TRAZODONE HCL 100 MG PO TABS: 100.0000 mg | ORAL_TABLET | Freq: Every evening | ORAL | Status: DC | PRN

## 2015-06-02 MED ORDER — GABAPENTIN 100 MG PO CAPS: 100.0000 mg | ORAL_CAPSULE | Freq: Three times a day (TID) | ORAL | Status: DC

## 2015-06-02 NOTE — Tx Team (Signed)
Interdisciplinary Treatment Plan Update (Adult) Date: 06/02/2015   Time Reviewed: 9:30 AM  Progress in Treatment: Attending groups: Intermittently Participating in groups: Minimally Taking medication as prescribed: Yes Tolerating medication: Yes Family/Significant other contact made: No, patient has declined collateral contact Patient understands diagnosis: Yes Discussing patient identified problems/goals with staff: Yes Medical problems stabilized or resolved: Yes Denies suicidal/homicidal ideation: Yes Issues/concerns per patient self-inventory: Yes Other:  New problem(s) identified: N/A  Discharge Plan or Barriers: Plans to return to [facility] to follow up with [facility].    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 She reports primary trigger for admission was coming off Kratom (which she has been using for anxiety) abruptly. Patient will benefit from crisis stabilization, medication evaluation, group therapy and psychoeducation, in addition to case management for discharge planning. At discharge it is recommended that Patient adhere to the established discharge plan and continue in treatment.   Review of initial/current patient goals per problem list:  1. Goal(s): Patient will participate in aftercare plan   Met: Yes   Target date: 3-5 days post admission date   As evidenced by: Patient will participate within aftercare plan AEB aftercare provider and housing plan at discharge being identified.  4/11: Goal met. Patient plans to return home to follow up with outpatient services at [facility].    2. Goal (s): Patient will exhibit decreased depressive symptoms and  suicidal ideations.   Met: Adequate for discharge per MD.   Target date: 3-5 days post admission date   As evidenced by: Patient will utilize self rating of depression at 3 or below and demonstrate decreased signs of depression or be deemed stable for discharge by MD.  4/11: Adequate for discharge per MD. Patient reports baseline levels of depression and reports feeling safe to discharge today.    3. Goal(s): Patient will demonstrate decreased signs and symptoms of anxiety.   Met: Adequate for discharge per MD   Target date: 3-5 days post admission date   As evidenced by: Patient will utilize self rating of anxiety at 3 or below and demonstrated decreased signs of anxiety, or be deemed stable for discharge by MD  4/11: Adequate for discharge per MD. Patient reports baseline levels of anxiety and reports feeling safe to discharge today.     4. Goal(s): Patient will demonstrate decreased signs of withdrawal due to substance abuse   Met: Yes   Target date: 3-5 days post admission date   As evidenced by: Patient will produce a CIWA/COWS score of 0, have stable vitals signs, and no symptoms of withdrawal  4/11: Goal met. No withdrawal symptoms reported at this time per medical chart.   Attendees: Patient:    Family:    Physician:  Dr. Sabra Heck 06/02/2015 9:30 AM  Nursing: Darrol Angel, Mayra Neer, RN 06/02/2015 9:30 AM  Clinical Social Worker: Tilden Fossa, LCSW 06/02/2015 9:30 AM  Other: Peri Maris, LCSWA  06/02/2015 9:30 AM  Other: Norberto Sorenson, P4CC 06/02/2015 9:30 AM  Other:  06/02/2015 9:30 AM  Other: Agustina Caroli, NP 06/02/2015 9:30 AM  Other:       Scribe for Treatment Team:  Tilden Fossa, [place]

## 2015-06-02 NOTE — Progress Notes (Signed)
Recreation Therapy Notes  Animal-Assisted Activity (AAA) Program Checklist/Progress Notes Patient Eligibility Criteria Checklist & Daily Group note for Rec Tx Intervention  Date: 04.12.2017 Time: 2:45pm Location: 400 Hall Dayroom   AAA/T Program Assumption of Risk Form signed by Patient/ or Parent Legal Guardian Yes  Patient is free of allergies or sever asthma Yes  Patient reports no fear of animals Yes  Patient reports no history of cruelty to animals Yes  Patient understands his/her participation is voluntary Yes  Behavioral Response: Did not attend.    Anthonee Gelin L Deuntae Kocsis, LRT/CTRS         Carles Florea L 06/02/2015 3:04 PM 

## 2015-06-02 NOTE — Progress Notes (Signed)
  [facility]charge [facility] :  Will you be returning to the same living situation after discharge:  Yes,  patient plans to [facility] At discharge, do you have transportation home?: Yes,  patient reports access to transportation Do you have the ability to pay for your medications: Yes,  patient will be provided with prescriptions at discharge  Release of information consent forms completed and in the chart;  Patient's signature needed at discharge.  Patient to Follow up at: Follow-up Information    [facility]   Specialty:  Behavioral Health   Why:  Walk-In Clinic is open Monday-Friday 8am-3pm.  Go as early in the day as possible and take food/drink with you.  Plan for a long day of assessments.   Contact information:   [address] [place] [address] [phone]       Next level of care provider has access to [facility] Link:no  Safety Planning and Suicide Prevention discussed: Yes,  with patient  Have you used any form of tobacco in the last 30 days? (Cigarettes, Smokeless Tobacco, Cigars, and/or Pipes): Yes  Has patient been referred to the Quitline?: Patient refused referral  Patient has been referred for addiction treatment: Yes  Niya Behler, [place]ristin L 06/02/2015, 10:06 AM

## 2015-06-02 NOTE — Discharge Summary (Signed)
Physician Discharge Summary Note  Patient:  Traci [name] is an 30 y.o., female MRN:  [id number] DOB:  05/28/1985 Patient phone:  [phone] (home)  Patient address:   [address], [place] [address]  [place] [address],  Total Time spent with patient: Greater than 30 minutes  Date of Admission:  05/30/2015 Date of Discharge: 06-02-15  Reason for Admission: Substance withdrawal symptoms/suicidal ideations.  Principal Problem: Substance induced mood disorder [facility](HCC)  Discharge Diagnoses: Patient Active Problem List   Diagnosis Date Noted  . Substance induced mood disorder (HCC) [F19.94] 05/30/2015  . Major depression, recurrent (HCC) [F33.9] 05/30/2015  . Polysubstance abuse [F19.10] 05/30/2015   Past Psychiatric History: Polysubstance abuse  Past Medical History:  Past Medical History  Diagnosis Date  . ADD (attention deficit disorder)   . Depression   . Substance abuse    History reviewed. No pertinent past surgical history.  Family History: History reviewed. No pertinent family history.  Family Psychiatric  History: See H&P  Social History:  History  Alcohol Use No     History  Drug Use  . Yes  . Special: Heroin    Social History   Social History  . Marital Status: Single    Spouse Name: N/A  . Number of Children: N/A  . Years of Education: N/A   Social History Main Topics  . Smoking status: Current Every Day Smoker -- 1.00 packs/day for 10 years    Types: Cigarettes  . Smokeless tobacco: None  . Alcohol Use: No  . Drug Use: Yes    Special: Heroin  . Sexual Activity: Not Currently    Birth Control/ Protection: IUD   Other Topics Concern  . None   Social History Narrative   Hospital Course:  30 Y/O female who states she started using was kicked out of her half way house. 9 months last use of heroin. Was using Xanax alcohol cocaine. Went to treatment, [facility] [place] started using this drug 3 months ago when she had 6  months, in January. States it was advertasied it was all natural available in the stores. States she has a lot of social anxiety. A month in was taking every day if she did not have it she would think about it. States it became unmanageable but could not give it up. The 7th was the first day off. States yesterday was feeling like a very mild opioid withdrawal. Has been increasingly more depressed, having thoughts of death.  Upon [facility] & [facility], Traci ClientHannah was evaluated & her presenting symptoms identified. The medication regimen for the presenting symptoms were discussed & initiated targeting those symptoms. She was enrolled in the group counseling sessions & encouraged to participate in the unit programming. Traci ClientHannah presented no other significant pre-existing medical problems that required treatment or monitoring. She was evaluated on daily basis by the clinical providers to assure her response to the treatment regimen.As her treatment progressed,  improvement was noted as evidenced by her report of decreasing symptoms, improved mood, medication tolerance & active participation in the unit programming. She was encouraged to update her providers on her progress by daily completion of a self inventory assessment noting mood, mental status, pain, any new symptoms, anxiety and or concerns.  Traci [name]'s symptoms responded well to her treatment regimen combined with a therapeutic and supportive environment. She was motivated for recovery as evidenced by a positive/appropriate behavior and her interaction with the staff & fellow  patients.She also worked closely with the treatment team and case manager to develop a discharge plan with appropriate goals to maintain sobriety after discharge. Coping skills, problem solving as well as relaxation therapies were also part of the unit programming.  Upon her hospital discharge, Traci [name] was in much improved condition than upon admission.Her symptoms  were reported as significantly decreased or resolved completely. She adamantly denies any SI/HI,  AVH, delusional thoughts paranoia or substance withdrawal symptoms. She was motivated to continue taking medication with a goal of continued improvement in mental health. She will continue psychiatric care on an outpatient basis as noted below. Traci [name] is provided with all the necessary information required to make this appointments without problem. She received a 7 days worth, supply samples of her [facility] discharge medications. She left [facility] with all personal belongings in no apparent distress. Transportation per arrangement.Traci [name] was medicated & discharged on; Gabapentin 100 mg for agitation, Hydroxyzine 50 mg for anxiety & Trazodone 100 mg for insomnia.  Physical Findings: AIMS: Facial and Oral Movements Muscles of Facial Expression: None, normal Lips and Perioral Area: None, normal Jaw: None, normal Tongue: None, normal,Extremity Movements Upper (arms, wrists, hands, fingers): None, normal Lower (legs, knees, ankles, toes): None, normal, Trunk Movements Neck, shoulders, hips: None, normal, Overall Severity Severity of abnormal movements (highest score from questions above): None, normal Incapacitation due to abnormal movements: None, normal Patient's awareness of abnormal movements (rate only patient's report): No Awareness, Dental Status Current problems with teeth and/or dentures?: No Does patient usually wear dentures?: No  CIWA:  CIWA-Ar Total: 1 COWS:  COWS Total Score: 0  Musculoskeletal: Strength & Muscle Tone: within normal limits Gait & Station: normal Patient leans: N/A  Psychiatric Specialty Exam: Review of Systems  Constitutional: Negative.   HENT: Negative.   Eyes: Negative.   Cardiovascular: Negative.   Gastrointestinal: Negative.   Genitourinary: Negative.   Musculoskeletal: Negative.   Skin: Negative.   Neurological: Negative.   Endo/Heme/Allergies: Negative.    Psychiatric/Behavioral: Positive for depression (Stable) and substance abuse (Polysubstance dependence). Negative for suicidal ideas, hallucinations and memory loss. The patient has insomnia (Stable). The patient is not nervous/anxious.     Blood pressure 113/72, pulse 68, temperature 98.2 F (36.8 C), temperature source Oral, resp. rate 16, height  (1.626 m), weight 73.936 kg (163 lb), last menstrual period 05/08/2015.Body mass index is 27.97 kg/(m^2).  See Md's SRA   Have you used any form of tobacco in the last 30 days? (Cigarettes, Smokeless Tobacco, Cigars, and/or Pipes): Yes  Has this patient used any form of tobacco in the last 30 days? (Cigarettes, Smokeless Tobacco, Cigars, and/or Pipes) Yes, Yes, A prescription for an FDA-approved tobacco cessation medication was offered at discharge and the patient refused  Blood Alcohol level:  Lab Results  Component Value Date   ETH <5 05/29/2015   [facility]  12/19/2008    <5        LOWEST DETECTABLE LIMIT FOR SERUM ALCOHOL IS 5 mg/dL FOR MEDICAL PURPOSES ONLY   Metabolic Disorder Labs:  No results found for: HGBA1C, MPG No results found for: PROLACTIN No results found for: CHOL, TRIG, HDL, CHOLHDL, VLDL, LDLCALC  See Psychiatric Specialty Exam and Suicide Risk Assessment completed by Attending Physician prior to discharge.  Discharge destination:  Other:  [facility]  Is patient on multiple antipsychotic therapies at discharge:  No   Has Patient had three or more failed trials of antipsychotic monotherapy by history:  No  Recommended Plan for Multiple Antipsychotic  Therapies: NA    Medication List    STOP taking these medications        MIDOL 200 MG Caps  Generic drug:  Ibuprofen     VITAMIN C PO      TAKE these medications      Indication   acetaminophen 325 MG tablet  Commonly known as:  TYLENOL  Take 2 tablets (650 mg total) by mouth every 6 (six) hours as needed for moderate pain.   Indication:  Fever, Pain      gabapentin 100 MG capsule  Commonly known as:  NEURONTIN  Take 1 capsule (100 mg total) by mouth 3 (three) times daily. For agitation   Indication:  Agitation     hydrOXYzine 50 MG tablet  Commonly known as:  ATARAX/VISTARIL  Take 1 tablet (50 mg total) by mouth every 6 (six) hours as needed for anxiety.   Indication:  Anxiety     nicotine 21 mg/24hr patch  Commonly known as:  NICODERM CQ - dosed in mg/24 hours  Place 1 patch (21 mg total) onto the skin daily. For nicotine addiction   Indication:  Nicotine Addiction     traZODone 100 MG tablet  Commonly known as:  DESYREL  Take 1 tablet (100 mg total) by mouth at bedtime as needed and may repeat dose one time if needed for sleep.   Indication:  Trouble Sleeping       Follow-up Information    Go to [facility]   Specialty:  Behavioral Health   Why:  Walk-In Clinic is open Monday-Friday 8am-3pm.  Go as early in the day as possible and take food/drink with you.  Plan for a long day of assessments.   Contact [address] [place] [address] [phone]      Follow-up recommendations:  Activity:  As tolerated Diet: As recommended by your primary care doctor. Keep all scheduled follow-up appointments as recommended.  Comments: Take all your medications as prescribed by your mental healthcare provider. Report any adverse effects and or reactions from your medicines to your outpatient provider promptly. Patient is instructed and cautioned to not engage in alcohol and or illegal drug use while on prescription medicines. In the event of worsening symptoms, patient is instructed to call the crisis hotline, 911 and or go to the nearest ED for appropriate evaluation and treatment of symptoms. Follow-up with your primary care provider for your other medical issues, concerns and or health care needs.   Signed: Sanjuana Kava, NP, PMHNP-BC 06/02/2015, 11:19 AM  I personally assessed the patient and formulated the  plan Madie Reno A. Dub Mikes, M.D.

## 2015-06-02 NOTE — [facility] Suicide Risk Assessment (Signed)
[facility]   Principal Problem: Substance induced mood disorder [facility](HCC) Discharge Diagnoses:  Patient Active Problem List   Diagnosis Date Noted  . Substance induced mood disorder (HCC) [F19.94] 05/30/2015  . Major depression, recurrent (HCC) [F33.9] 05/30/2015  . Polysubstance abuse [F19.10] 05/30/2015    Total Time spent with patient: 20 minutes  Musculoskeletal: Strength & Muscle Tone: within normal limits Gait & Station: normal Patient leans: normal  Psychiatric Specialty Exam: Review of Systems  Constitutional: Negative.   HENT: Negative.   Eyes: Negative.   Respiratory: Negative.   Cardiovascular: Negative.   Gastrointestinal: Negative.   Genitourinary: Negative.   Musculoskeletal: Negative.   Skin: Negative.   Endo/Heme/Allergies: Negative.   Psychiatric/Behavioral: Positive for substance abuse.    Blood pressure 113/72, pulse 68, temperature 98.2 F (36.8 C), temperature source Oral, resp. rate 16, height 5\' 4"  (1.626 m), weight 73.936 kg (163 lb), last menstrual period 05/08/2015.Body mass index is 27.97 kg/(m^2).  General Appearance: Fairly Groomed  Patent attorneyye Contact::  Fair  Speech:  Clear and Coherent409  Volume:  Normal  Mood:  Euthymic  Affect:  Appropriate  Thought Process:  Coherent and Goal Directed  Orientation:  Full (Time, Place, and Person)  Thought Content:  plans as she moves on, relapse prevention plan  Suicidal Thoughts:  No  Homicidal Thoughts:  No  Memory:  Immediate;   Fair Recent;   Fair Remote;   Fair  Judgement:  Fair  Insight:  Present  Psychomotor Activity:  Normal  Concentration:  Fair  Recall:  FiservFair  Fund of Knowledge:Fair  Language: Fair  Akathisia:  No  Handed:  Right  AIMS (if indicated):     Assets:  Desire for Improvement Housing Social Support Vocational/Educational  Sleep:  Number of Hours: 6.75  Cognition: WNL  ADL's:  Intact  In full contact with reality. There are no active S/S of  withdrawal. There are no active SI plans or intent. [facility] and continue to work a relapse prevention plan/ Mental Status Per Nursing Assessment::   On Admission:  NA  Demographic Factors:  Caucasian  Loss Factors: none identified  Historical Factors: Family history of mental illness or substance abuse  Risk Reduction Factors:   Employed, Living with another person, especially a relative and Positive social support  Continued Clinical Symptoms:  Alcohol/Substance Abuse/Dependencies  Cognitive Features That Contribute To Risk:  None    Suicide Risk:  Minimal: No identifiable suicidal ideation.  Patients presenting with no risk factors but with morbid ruminations; may be classified as minimal risk based on the severity of the depressive symptoms  Follow-up Information    [facility]   Specialty:  Behavioral Health   Why:  Walk-In Clinic is open Monday-Friday 8am-3pm.  Go as early in the day as possible and take food/drink with you.  Plan for a long day of assessments.   Contact information:   [address] [place] [address] [phone]       Plan Of Care/Follow-up recommendations:  Activity:  as tolerated Diet:  regular Follow up as above Zondra Lawlor A, MD 06/02/2015, 12:03 PM

## 2015-06-02 NOTE — [facility] Group Notes (Signed)
[facility] LCSW Group Therapy 06/02/2015 1:15 PM Type of Therapy: Group Therapy Participation Level: Active  Participation Quality: Attentive, Sharing and Supportive  Affect: Appropriate  Cognitive: Alert and Oriented  Insight: Developing/Improving and Engaged  Engagement in Therapy: Developing/Improving and Engaged  Modes of Intervention: Activity, Clarification, Confrontation, Discussion, Education, Exploration, Limit-setting, Orientation, Problem-solving, Rapport Building, Reality Testing, Socialization and Support  Summary of Progress/Problems: Patient was attentive and engaged with speaker from [employer]. Patient was attentive to speaker while they shared their story of dealing with mental health and overcoming it. Patient expressed interest in their programs and services and received information on their agency. Patient processed ways they can relate to the speaker.   Lynze Reddy, LCSW Clinical Social Worker [facility] [phone]   

## 2015-06-02 NOTE — Progress Notes (Signed)
Discharge note: Pt received both written and verbal discharge instructions. Pt verbalized understanding of discharge instructions and signed discharge instructions. Pt agreed to f/u appt and med regimen. Pt received sample meds, prescriptions, SRA, AVS and trans record. Pt received belongings from room and locker.

## 2015-06-02 NOTE — Progress Notes (Signed)
Pt reports the day has been good and she is looking forward to possibly discharging tomorrow.  She is hoping to [facility] back [facility] prior to her admission.  She denies SI/HI/AVH.  She denies any withdrawal symptoms at this time.  Pt voices no needs or concerns to Clinical research associatewriter.  Support and encouragement offered.  Discharge plans are in process.  Safety maintained with q15 minute checks.

## 2015-08-01 ENCOUNTER — Emergency Department (HOSPITAL_COMMUNITY)
Admission: EM | Admit: 2015-08-01 | Discharge: 2015-08-01 | Disposition: A | Discharge status: Home / Self Care | Payer: No Typology Code available for payment source | Attending: Emergency Medicine | Admitting: Emergency Medicine

## 2015-08-01 ENCOUNTER — Encounter (HOSPITAL_COMMUNITY): Payer: Self-pay | Admitting: Emergency Medicine

## 2015-08-01 DIAGNOSIS — F17210 Nicotine dependence, cigarettes, uncomplicated: Secondary | ICD-10-CM | POA: Insufficient documentation

## 2015-08-01 DIAGNOSIS — F329 Major depressive disorder, single episode, unspecified: Secondary | ICD-10-CM | POA: Insufficient documentation

## 2015-08-01 DIAGNOSIS — T401X4A Poisoning by heroin, undetermined, initial encounter: Secondary | ICD-10-CM | POA: Insufficient documentation

## 2015-08-01 LAB — CBG MONITORING, ED: GLUCOSE-CAPILLARY: 114 mg/dL — AB (ref 65–99)

## 2015-08-01 MED ORDER — ALUM & MAG HYDROXIDE-SIMETH 200-200-20 MG/5ML PO SUSP
15.0000 mL | Freq: Once | ORAL | Status: AC
  Administered 2015-08-01: 15 mL via ORAL
  Filled 2015-08-01: qty 30

## 2015-08-01 NOTE — Discharge Instructions (Signed)
Read the information below.   You were given maalox for heartburn in the ED. You oxygen saturation and respirations are normal.  Make sure that you have someone with you for the next few hours, monitoring your breathing.  Use the prescribed medication as directed.  Please discuss all new medications with your pharmacist.   You can buy Tums at the pharmacy for heartburn relief.  I have provided the contact information for outpatient and inpatient rehabilitation facilities if you are interested in detox. Be sure to call and follow up with [facility].   I have provided the contact information for [facility] and [facility], be sure to follow up within one week for re-evaluation.  You may return to the Emergency Department at any time for worsening condition or any new symptoms that concern you. Return to ED if you develop fever, chills, night sweats, shortness of breath, chest pain, loss of consciousness, seizures, or stop breathing.    Community Resource Guide Outpatient Counseling/Substance Abuse Adult The United Ways 211 is a great source of information about community services available.  Access by dialing 2-1-1 from anywhere in [place] , or by website -  PooledIncome.plwww.nc211.org.   Other Local Resources (Updated 02/2015)  Crisis Hotlines   Services     Area Served  [employer] [facility]  Crisis Hotline, available 24 hours a day, 7 days a week: [phone] [facility] [place] [place], [place]   [facility]  Crisis Hotline, available 24 hours a day, 7 days a week: [phone] [facility], [place]  [facility]  Suicide Prevention Hotline, available 24 hours a day, 7 days a week: 249-817-0576 [facility] [place], [place]  [facility]   Crisis Hotline, available 24 hours a day, 7 days a week: [phone] [phone] [facility], [place]   [facility] Access to [employer]Care Line  Crisis Hotline, available 24 hours a day, 7 days a week: 5647341605262 [phone] All   [facility]  Crisis Hotline,  available 24 hours a day, 7 days a week: (863)410-8074(613)257-7576 All   Other Local Resources (Updated 02/2015)  Outpatient Counseling/ Substance Abuse Programs  Services     Address and Phone Number  ADS (Alcohol and Drug Services)   Options include Individual counseling, group counseling, intensive outpatient program (several hours a day, several days a week)  Offers depression assessments  Provides methadone maintenance program [phone] [address], [address] [place], [place] [address]   [facility]   Offers partial hospitalization/day treatment and DUI/DWI programs  [employer]ccepts Medicare, private insurance [phone] [address], [address] [place], [place] [address]  [facility]    Services include intensive outpatient program (several hours a day, several days a week), outpatient treatment, DUI/DWI services, family education  Also has some services specifically for [employer]  Offers transitional housing  [phone] [address] [place], [place] [address]     [facility]  [employer]orporatedccepts Medicare, private pay, and private insurance [phone] [address], [address] [place], [place] [address]  [facility]  Services include individual counseling, substance abuse intensive outpatient program (several hours a day, several days a week), day treatment  Delene Lollccepts Medicare, Medicaid, private insurance [phone] [address], [address] [place], [place] [address]  [facility]   Offers substance abuse intensive outpatient program (several hours a day, several days a week), partial hospitalization program [phone] [address] [place], [place] [address]  [phone] [address] [place], [place] [address]  [phone] [address] [place], [place]  [address]  [phone] [address] [place], [place] [address]  [facility]  Individual  counseling only  Accepts private insurance only [phone] [address], [address] [place], [place] [address]  [facility]  Methadone maintenance program [phone] [address] [place], [place] [address]  [facility]  Walk-In Clinic providing substance abuse and mental health counseling  Accepts Medicaid, Medicare, private insurance  Offers sliding scale for uninsured [phone] [address] [place], [place]   [facility] in [place], [employer].  Offers individual counseling, and intensive in-home services [phone] [address], [address] [place], [place] [address]  [facility] individual counseling, family counseling, group therapy, domestic violence counseling, consumer credit counseling  Accepts Medicare, Medicaid, private insurance  Offers sliding scale for uninsured [phone] [address] [place], [place] [address]  [phone] [facility], [address] [place], [place] [address]  [facility]  Offers individual, family and group counseling  3 locations - [place], [place], and [place]  [phone]  [address] [place], [place] [address]  [address] [place], [place] [address]  [address] [place], [place] [address]  [facility] Margo Aye    Offers psychiatric assessment, 8-week Intensive Outpatient Program (several hours a day, several times a week, daytime or evenings), early recovery group, family Program, medication management  Private pay or private insurance only [phone], or  [phone] [address] [place], [place] [address]  [place] [employer] individual, couples and family counseling  Accepts Medicaid, private insurance, and sliding scale for uninsured [phone] [address] [place], [place] [address]  Len Blalock, MD  Individual counseling  Private insurance [phone] [address] [place], [place] [address]  [facility]   Offers assessment, substance abuse treatment, and behavioral health treatment [phone] [address] [place], [place] [address]  [facility]  Individual counseling  Accepts private insurance [phone] [address] [place], [place] [address]  [facility]  Individual counseling  Delene Loll, private insurance [phone] [address] [place], [place] [address]  [facility]    Offers intensive outpatient program (several hours a day, several times a week)  Private pay, private insurance [phone] [facility] [place], [place]  [facility]  Individual counseling  Medicare, private insurance [phone] [address] [place], [place] [address]  [facility]    Offers intensive outpatient program (several hours a day, several times a week) and partial hospitalization program [phone] [address] [place], [place] [address]  Emerson Monte, MD  Individual counseling [phone] [address] [place], [place] [address]  [facility]  Offers Christian counseling to individuals, couples, and families  Accepts Medicare and private insurance; offers sliding scale for uninsured [phone] [address] [place], [place] [address]  [facility]  [place] counseling [phone] [address] [place], [place] [address]  [facility] crisis counseling, individual counseling, group therapy, in-home therapy, domestic violence services, day treatment, DWI services, Administrator, arts (CST), Assertive Community Treatment Team (ACTT), substance abuse Intensive Outpatient Program (several hours a day, several times a week)  2 locations - [place] and [place] [phone] [address] [place], [place]  [address]  [phone] [address] [place], [place] [address]  [facility]     Individual counseling and group therapy  Accepts private insurance, [place], [place] [phone]  [address] [place], [place]  [facility]  Offers individual and family counseling  Offers LGBTQ services  Accepts private insurance and private pay [phone] [address] [place], [place] [address]  [facility]    Offers individual counseling, group therapy, and outpatient detox  Accepts private insurance [phone] [address] [place], [place]  [facility]  Individual counseling  Accepts Medicare, private insurance [phone] [address], [address] [place], [place] [address]  [employer]  Individual counseling  Accepts Medicare, private insurance [phone] [address] [place], [place] [address]  Gilman Buttner [facility]   Offers substance abuse Intensive Outpatient Program (several hours a day, several times a week) [phone], or [phone] [place], [place]    [employer] Guide Inpatient Behavioral Health/Residential  Substance Abuse Treatment Adults The United Ways 211 is a great source of information about community services available.  Access by dialing 2-1-1 from anywhere in [place], or by website -  PooledIncome.pl.   (Updated 02/2015)  Crisis Assistance 24 hours a day   Services Offered    Area [employer]  24-hour crisis assistance: [phone] [place], [place]   [facility]  24-hour crisis assistance:[phone] [place], [place]  [place]   24-hour crisis assistance: [phone] [place], [place]   [facility] Access to Care Line  24-hour crisis assistance; [phone] All   [facility]  24-hour crisis response line: [phone] All   Other Local Resources  (Updated 02/2015)  Inpatient Behavioral Health/Residential Substance Abuse Treatment Programs   Services      Address and Phone Number  [facility] ([facility])   14-day residential rehabilitation  [phone] [address] [place], [place]  [facility] ([facility])    Detox - private pay only  14-day residential rehabilitation -  Medicaid, insurance, private pay only [phone], or [phone] [address] [place], [place] [address]   [facility] [employer] only  Multiple facilities [phone] admissions   [facility] ([facility])   90-day program  Must be homeless to participate  [phone], or [phone] Marcy Panning, [facility]  [facility] only [phone], or  [phone] [address] [place], [place] [address]  [facility]     Must make an appointment  Transportation is offered from [employer] on [employer].  Accepts private pay, Sheryn Bison [facility] [phone]  [address] [place], [place] [address]   [employer]  Females only  Associated with the [facility] [phone] [address] [place], [place] [address]  [facility] only [phone], or [phone] [address] [place], [address]  [facility]    Detox  Residential rehabilitation  Private insurance only  Multiple locations [phone] admissions  [facility]    Private pay  Private insurance [phone] [address] [place], [place] [address]  [facility]    Males only  Fee required at time of admission [phone] [address] [place], [place] [address]  [facility]    Private pay only  [phone] [address] [place], [place]  RTS (Residential Firefighter)    Detox - private pay, Medicaid  Residential  rehabilitation for males  - Medicare, Medicaid, insurance, private pay [phone] [address] [place], [place]   [facility]    Walk-in interviews Monday - Saturday from 8 am - 4  pm  Individuals with legal charges are not eligible [phone] [address] [place], [place] [address]  [facility]   Must be willing to work  Must attend Alcoholics Anonymous meetings [phone] [address] [place], [place]   [facility] [employer]    Faith-based program  Private pay only [phone] [address] [place], [place]   Narcotic Overdose A narcotic overdose is the misuse or overuse of a narcotic drug. A narcotic overdose can make you pass out and stop breathing. If you are not treated right away, this can cause permanent brain damage or stop your heart. Medicine may be given to reverse the effects of an overdose. If so, this medicine may bring on withdrawal symptoms. The symptoms may be abdominal cramps, throwing up (vomiting), sweating, chills, and nervousness. Injecting narcotics can cause more problems than just an overdose. AIDS, hepatitis, and other very serious infections are transmitted by sharing needles and syringes. If you decide to quit using, there are medicines which can help you through the withdrawal period. Trying to quit all at once on your own can be uncomfortable, but not life-threatening. Call your caregiver, Narcotics Anonymous, or any drug and alcohol treatment program for further help.    This information is not intended to replace advice given to you by your health care provider. Make sure you discuss any questions you have with your health care provider.   Document Released: 03/17/2004 Document Revised: 02/28/2014 Document Reviewed: 07/30/2014 Elsevier Interactive Patient Education [employer]

## 2015-08-01 NOTE — ED Notes (Signed)
Pt was found on floor at hair salon after a possible overdose of Heroin. Pt was woken up easily by EMS. Pt states she was clean for 10mos and relapsed recently. Pt does not know how much she took, she states just a lil bit. [employer] at bedside with possession of drugs.

## 2015-08-01 NOTE — ED Provider Notes (Signed)
CSN: [id number]     Arrival date & time 08/01/15  1051 History   First MD Initiated Contact with Patient 08/01/15 1053     Chief Complaint  Patient presents with  . Drug Overdose     (Consider location/radiation/quality/duration/timing/severity/associated sxs/prior Treatment) HPI Comments: Traci [name] is a 30 y.o. female with history of substance abuse, depression, and anxiety presents to ED via [employer] for heroin overdose. Patient endorses injecting heroin today. Per nursing, she was found on the floor at the hair salon. She was easily aroused at the scene. She did not receive narcan. She endorses history of heroin abuse, she was clean for 10 months. States she started using again to self medicate her depression and anxiety. She endorses feelings of depression, loss of interest in activity, change in appetite, difficulty concentrating, and agitation. She denies anxiety currently. Denies suicidal or homicidal ideations. She complains currently of heartburn, generalized fatigue, and headache.   Patient is a 30 y.o. female presenting with Overdose. The history is provided by the patient.  Drug Overdose Associated symptoms include fatigue ( generalized) and headaches ( 6-7/10). Pertinent negatives include no abdominal pain, chest pain, chills, diaphoresis, fever, nausea, neck pain, numbness, sore throat, vomiting or weakness.    Past Medical History  Diagnosis Date  . ADD (attention deficit disorder)   . Depression   . Substance abuse    History reviewed. No pertinent past surgical history. History reviewed. No pertinent family history. Social History  Substance Use Topics  . Smoking status: Current Every Day Smoker -- 1.00 packs/day for 10 years    Types: Cigarettes  . Smokeless tobacco: None  . Alcohol Use: No   OB History    No data available     Review of Systems  Constitutional: Positive for appetite change and fatigue ( generalized). Negative for fever, chills and diaphoresis.   HENT: Negative for sore throat.   Eyes: Negative for visual disturbance.  Cardiovascular: Negative for chest pain and palpitations.  Gastrointestinal: Negative for nausea, vomiting and abdominal pain.       Heart burn  Genitourinary: Negative for dysuria and hematuria.  Musculoskeletal: Negative for neck pain.  Skin: Positive for color change ( redness on hands) and wound ( multiple injection sites on hands b/l).  Neurological: Positive for headaches ( 6-7/10). Negative for dizziness, weakness, light-headedness and numbness.  Psychiatric/Behavioral: Positive for decreased concentration and agitation. Negative for suicidal ideas. The patient is not nervous/anxious.       Allergies  Zinc oxide  Home Medications   Prior to Admission medications   Medication Sig Start Date End Date Taking? Authorizing Provider  acetaminophen (TYLENOL) 325 MG tablet Take 2 tablets (650 mg total) by mouth every 6 (six) hours as needed for moderate pain. 06/02/15   Sanjuana Kava, NP  gabapentin (NEURONTIN) 100 MG capsule Take 1 capsule (100 mg total) by mouth 3 (three) times daily. For agitation 06/02/15   Sanjuana Kava, NP  hydrOXYzine (ATARAX/VISTARIL) 50 MG tablet Take 1 tablet (50 mg total) by mouth every 6 (six) hours as needed for anxiety. 06/02/15   Sanjuana Kava, NP  nicotine (NICODERM CQ - DOSED IN MG/24 HOURS) 21 mg/24hr patch Place 1 patch (21 mg total) onto the skin daily. For nicotine addiction 06/02/15   Sanjuana Kava, NP  traZODone (DESYREL) 100 MG tablet Take 1 tablet (100 mg total) by mouth at bedtime as needed and may repeat dose one time if needed for sleep. 06/02/15   Nicole Kindred  I Nwoko, NP   BP 117/76 mmHg  Pulse 98  Temp(Src) 99 F (37.2 C) (Oral)  Resp 18  Ht 5\' 5"  (1.651 m)  Wt 74.844 kg  BMI 27.46 kg/m2  SpO2 99% Physical Exam  Constitutional: She appears well-developed and well-nourished. No distress.  HENT:  Head: Normocephalic and atraumatic.  Mouth/Throat: Oropharynx is clear and  moist. No oropharyngeal exudate.  Eyes: Conjunctivae and EOM are normal. Pupils are equal, round, and reactive to light. Right eye exhibits no discharge. Left eye exhibits no discharge. No scleral icterus.  3mm pupils  Neck: Normal range of motion. Neck supple.  Cardiovascular: Normal rate, regular rhythm, normal heart sounds and intact distal pulses.   No murmur heard. Pulmonary/Chest: Effort normal and breath sounds normal. No respiratory distress.  Abdominal: Soft. Bowel sounds are normal. There is no tenderness. There is no rebound and no guarding.  Musculoskeletal: Normal range of motion.  Lymphadenopathy:    She has no cervical adenopathy.  Neurological: She is alert. Coordination normal.  Mental Status:  Alert, oriented, thought content appropriate, able to give a coherent history. Speech fluent without evidence of aphasia. Able to follow 2 step commands without difficulty.  Cranial Nerves:  II:  Peripheral visual fields grossly normal, pupils equal, round, reactive to light III,IV, VI: ptosis not present, extra-ocular motions intact bilaterally  V,VII: smile symmetric, facial light touch sensation equal VIII: hearing grossly normal to voice  X: uvula elevates symmetrically  XI: bilateral shoulder shrug symmetric and strong XII: midline tongue extension without fassiculations Motor:  Normal tone. 5/5 in upper and lower extremities bilaterally including strong and equal grip strength and dorsiflexion/plantar flexion Sensory: Pinprick and light touch normal in all extremities.  Cerebellar: normal finger-to-nose with bilateral upper extremities Gait: normal gait and balance CV: distal pulses palpable throughout     Skin: Skin is warm and dry. She is not diaphoretic. There is erythema.  Erythema and warmth noted on hands b/l. Patient endorses injecting heroin in hands. Multiple injection sites noted.   Psychiatric: She is withdrawn. She exhibits a depressed mood.    ED Course   Procedures (including critical care time) Labs Review Labs Reviewed  CBG MONITORING, ED - Abnormal; Notable for the following:    Glucose-Capillary 114 (*)    All other components within normal limits    Imaging Review No results found. I have personally reviewed and evaluated these images and lab results as part of my medical decision-making.   EKG Interpretation None      MDM   Final diagnoses:  Heroin overdose, undetermined intent, initial encounter   Patient is afebrile and non-toxic appearing. Patient is withdrawn and avoids eye contact. Vital signs are stable. She is maintaining her airway and her secretions. O2 sats 99%, respirations 18. Physical exam remarkable for multiple injection sites on hands with associated warmth and erythema. Neurologic exam benign. Patient politely declines TTS consult. Will give Maalox for heart burn.   11:42 AM: Notified by nursing patient was outside smoking. [facility], educated [facility]. When asked again, patient continues to denies SI/HI. Provided OP/IP resources for substance abuse. Discussed with patient the importance of following up with [facility]. Discussed return precautions. Patient voiced understanding and is agreeable.   Lona Kettleshley Laurel Radames Mejorado, [place]-C 08/02/15 1810  Lyndal Pulleyaniel Knott, MD 08/04/15 [phone]

## 2015-08-01 NOTE — ED Notes (Signed)
Found patient room empty. Found patient outside of ED main entrance smoking a cigarette. Patient was advised she had not been discharged and smoking is against hospital policy. Patient ambulated back to room voluntarily. RN, PA, and MD notified.

## 2015-08-03 ENCOUNTER — Ambulatory Visit (HOSPITAL_COMMUNITY)
Admission: RE | Admit: 2015-08-03 | Discharge: 2015-08-03 | Disposition: A | Discharge status: Home / Self Care | Payer: Self-pay | Attending: Psychiatry | Admitting: Psychiatry

## 2015-08-03 ENCOUNTER — Emergency Department (HOSPITAL_COMMUNITY)
Admission: EM | Admit: 2015-08-03 | Discharge: 2015-08-04 | Disposition: A | Discharge status: Other Acute Inpatient Hospital | Payer: No Typology Code available for payment source | Attending: Emergency Medicine | Admitting: Emergency Medicine

## 2015-08-03 ENCOUNTER — Encounter (HOSPITAL_COMMUNITY): Payer: Self-pay | Admitting: Emergency Medicine

## 2015-08-03 DIAGNOSIS — F1994 Other psychoactive substance use, unspecified with psychoactive substance-induced mood disorder: Secondary | ICD-10-CM | POA: Diagnosis present

## 2015-08-03 DIAGNOSIS — F333 Major depressive disorder, recurrent, severe with psychotic symptoms: Secondary | ICD-10-CM | POA: Insufficient documentation

## 2015-08-03 DIAGNOSIS — R45851 Suicidal ideations: Secondary | ICD-10-CM | POA: Insufficient documentation

## 2015-08-03 DIAGNOSIS — F909 Attention-deficit hyperactivity disorder, unspecified type: Secondary | ICD-10-CM | POA: Insufficient documentation

## 2015-08-03 DIAGNOSIS — F1124 Opioid dependence with opioid-induced mood disorder: Secondary | ICD-10-CM | POA: Insufficient documentation

## 2015-08-03 DIAGNOSIS — F17210 Nicotine dependence, cigarettes, uncomplicated: Secondary | ICD-10-CM | POA: Insufficient documentation

## 2015-08-03 DIAGNOSIS — F1120 Opioid dependence, uncomplicated: Secondary | ICD-10-CM | POA: Diagnosis present

## 2015-08-03 DIAGNOSIS — F988 Other specified behavioral and emotional disorders with onset usually occurring in childhood and adolescence: Secondary | ICD-10-CM | POA: Insufficient documentation

## 2015-08-03 DIAGNOSIS — F1914 Other psychoactive substance abuse with psychoactive substance-induced mood disorder: Secondary | ICD-10-CM | POA: Insufficient documentation

## 2015-08-03 DIAGNOSIS — F329 Major depressive disorder, single episode, unspecified: Secondary | ICD-10-CM | POA: Insufficient documentation

## 2015-08-03 LAB — CBC
HCT: 38 % (ref 36.0–46.0)
HEMOGLOBIN: 13.5 g/dL (ref 12.0–15.0)
MCH: 31 pg (ref 26.0–34.0)
MCHC: 35.5 g/dL (ref 30.0–36.0)
MCV: 87.4 fL (ref 78.0–100.0)
Platelets: 256 10*3/uL (ref 150–400)
RBC: 4.35 MIL/uL (ref 3.87–5.11)
RDW: 12.7 % (ref 11.5–15.5)
WBC: 10.7 10*3/uL — ABNORMAL HIGH (ref 4.0–10.5)

## 2015-08-03 LAB — COMPREHENSIVE METABOLIC PANEL
ALBUMIN: 4 g/dL (ref 3.5–5.0)
ALT: 21 U/L (ref 14–54)
ANION GAP: 9 (ref 5–15)
AST: 17 U/L (ref 15–41)
Alkaline Phosphatase: 83 U/L (ref 38–126)
BUN: 9 mg/dL (ref 6–20)
CALCIUM: 9.2 mg/dL (ref 8.9–10.3)
CO2: 21 mmol/L — AB (ref 22–32)
CREATININE: 0.56 mg/dL (ref 0.44–1.00)
Chloride: 107 mmol/L (ref 101–111)
GFR calc non Af Amer: >60 mL/min (ref >60)
Glucose, Bld: 110 mg/dL — ABNORMAL HIGH (ref 65–99)
POTASSIUM: 3.6 mmol/L (ref 3.5–5.1)
SODIUM: 137 mmol/L (ref 135–145)
Total Bilirubin: 0.8 mg/dL (ref 0.3–1.2)
Total Protein: 7.3 g/dL (ref 6.5–8.1)

## 2015-08-03 LAB — I-STAT BETA HCG BLOOD, ED (MC, WL, AP ONLY): I-stat hCG, quantitative: 6.5 m[IU]/mL — ABNORMAL HIGH (ref <5)

## 2015-08-03 LAB — ETHANOL

## 2015-08-03 LAB — ACETAMINOPHEN LEVEL: Acetaminophen (Tylenol), Serum: <10 ug/mL — ABNORMAL LOW (ref 10–30)

## 2015-08-03 LAB — HCG, QUANTITATIVE, PREGNANCY: hCG, Beta Chain, Quant, S: <1 m[IU]/mL (ref <5)

## 2015-08-03 LAB — SALICYLATE LEVEL: Salicylate Lvl: <4 mg/dL (ref 2.8–30.0)

## 2015-08-03 NOTE — ED Notes (Signed)
Pt states that she relapsed after being clean from heroin x 10 months and is now having suicidal ideation. [facility] and was sent over here due to lack of beds and for medical clearance. Alert and oriented.

## 2015-08-03 NOTE — ED Provider Notes (Signed)
CSN: [id number]     Arrival date & time 08/03/15  2209 History   First MD Initiated Contact with Patient 08/03/15 2311     Chief Complaint  Patient presents with  . Suicidal      HPI  Expand All Collapse All   Pt states that she relapsed after being clean from heroin x 10 months and is now having suicidal ideation. Saw counselor at [facility] and was sent over here due to lack of beds and for medical clearance. Alert and oriented.       Patient states that she has attempted suicide twice in the past.  She last injected heroin around noon today.  Past Medical History  Diagnosis Date  . ADD (attention deficit disorder)   . Depression   . Substance abuse    History reviewed. No pertinent past surgical history. History reviewed. No pertinent family history. Social History  Substance Use Topics  . Smoking status: Current Every Day Smoker -- 1.00 packs/day for 10 years    Types: Cigarettes  . Smokeless tobacco: None  . Alcohol Use: No   OB History    No data available     Review of Systems  Psychiatric/Behavioral: Positive for suicidal ideas.  All other systems reviewed and are negative.     Allergies  Zinc oxide  Home Medications   Prior to Admission medications   Medication Sig Start Date End Date Taking? Authorizing Provider  acetaminophen (TYLENOL) 325 MG tablet Take 2 tablets (650 mg total) by mouth every 6 (six) hours as needed for moderate pain. 06/02/15   Sanjuana Kava, NP  gabapentin (NEURONTIN) 100 MG capsule Take 1 capsule (100 mg total) by mouth 3 (three) times daily. For agitation Patient not taking: Reported on 08/03/2015 06/02/15   Sanjuana Kava, NP  hydrOXYzine (ATARAX/VISTARIL) 50 MG tablet Take 1 tablet (50 mg total) by mouth every 6 (six) hours as needed for anxiety. Patient not taking: Reported on 08/03/2015 06/02/15   Sanjuana Kava, NP  nicotine (NICODERM CQ - DOSED IN MG/24 HOURS) 21 mg/24hr patch Place 1 patch (21 mg total) onto the skin daily. For nicotine  addiction Patient not taking: Reported on 08/03/2015 06/02/15   Sanjuana Kava, NP  traZODone (DESYREL) 100 MG tablet Take 1 tablet (100 mg total) by mouth at bedtime as needed and may repeat dose one time if needed for sleep. Patient not taking: Reported on 08/03/2015 06/02/15   Sanjuana Kava, NP   BP 112/69 mmHg  Pulse 57  Temp(Src) 98.3 F (36.8 C) (Oral)  Resp 16  Ht  (1.626 m)  Wt 159 lb (72.122 kg)  BMI 27.28 kg/m2  SpO2 100%  LMP 07/23/2015 (Approximate) Physical Exam  Constitutional: She is oriented to person, place, and time. She appears well-developed and well-nourished. No distress.  HENT:  Head: Normocephalic and atraumatic.  Eyes: Pupils are equal, round, and reactive to light.  Neck: Normal range of motion.  Cardiovascular: Normal rate and intact distal pulses.   Pulmonary/Chest: No respiratory distress.  Abdominal: Normal appearance. She exhibits no distension.  Musculoskeletal: Normal range of motion.  Neurological: She is alert and oriented to person, place, and time. No cranial nerve deficit.  Skin: Skin is warm and dry. No rash noted.  Psychiatric: She has a normal mood and affect. Her behavior is normal. She expresses suicidal ideation.  Nursing note and vitals reviewed.   ED Course  Procedures (including critical care time) Labs Review Labs Reviewed  COMPREHENSIVE METABOLIC PANEL - Abnormal; Notable for the following:    CO2 21 (*)    Glucose, Bld 110 (*)    All other components within normal limits  ACETAMINOPHEN LEVEL - Abnormal; Notable for the following:    Acetaminophen (Tylenol), Serum <10 (*)    All other components within normal limits  CBC - Abnormal; Notable for the following:    WBC 10.7 (*)    All other components within normal limits  URINE RAPID DRUG SCREEN, HOSP PERFORMED - Abnormal; Notable for the following:    Opiates POSITIVE (*)    All other components within normal limits  I-STAT BETA HCG BLOOD, ED ([facility], [facility], [facility] ONLY) - Abnormal;  Notable for the following:    I-stat hCG, quantitative 6.5 (*)    All other components within normal limits  ETHANOL  SALICYLATE LEVEL  HCG, QUANTITATIVE, PREGNANCY        MDM   Final diagnoses:  Substance induced mood disorder (HCC)  Heroin dependence (HCC)        Nelva Nayobert Kinaya Hilliker, MD 08/06/15 1547

## 2015-08-03 NOTE — ED Notes (Addendum)
Pt has in belonging bag:  Black duffle bag with misc clothes, 89 dollars, black cracked phone
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
## 2015-08-04 ENCOUNTER — Encounter (HOSPITAL_COMMUNITY): Payer: Self-pay

## 2015-08-04 ENCOUNTER — Inpatient Hospital Stay (HOSPITAL_COMMUNITY)
Admission: AD | Admit: 2015-08-04 | Discharge: 2015-08-08 | DRG: 897 | Disposition: A | Discharge status: Home / Self Care | Payer: No Typology Code available for payment source | Source: Intra-hospital | Attending: Psychiatry | Admitting: Psychiatry

## 2015-08-04 DIAGNOSIS — Z590 Homelessness: Secondary | ICD-10-CM

## 2015-08-04 DIAGNOSIS — G4700 Insomnia, unspecified: Secondary | ICD-10-CM | POA: Diagnosis present

## 2015-08-04 DIAGNOSIS — G4710 Hypersomnia, unspecified: Secondary | ICD-10-CM | POA: Diagnosis present

## 2015-08-04 DIAGNOSIS — F1120 Opioid dependence, uncomplicated: Secondary | ICD-10-CM | POA: Diagnosis present

## 2015-08-04 DIAGNOSIS — F1994 Other psychoactive substance use, unspecified with psychoactive substance-induced mood disorder: Secondary | ICD-10-CM

## 2015-08-04 DIAGNOSIS — R45851 Suicidal ideations: Secondary | ICD-10-CM | POA: Diagnosis not present

## 2015-08-04 DIAGNOSIS — F1123 Opioid dependence with withdrawal: Secondary | ICD-10-CM | POA: Insufficient documentation

## 2015-08-04 DIAGNOSIS — F1124 Opioid dependence with opioid-induced mood disorder: Principal | ICD-10-CM | POA: Diagnosis present

## 2015-08-04 DIAGNOSIS — Z818 Family history of other mental and behavioral disorders: Secondary | ICD-10-CM

## 2015-08-04 DIAGNOSIS — F411 Generalized anxiety disorder: Secondary | ICD-10-CM | POA: Diagnosis present

## 2015-08-04 DIAGNOSIS — F331 Major depressive disorder, recurrent, moderate: Secondary | ICD-10-CM | POA: Diagnosis present

## 2015-08-04 DIAGNOSIS — F17210 Nicotine dependence, cigarettes, uncomplicated: Secondary | ICD-10-CM | POA: Diagnosis present

## 2015-08-04 DIAGNOSIS — F341 Dysthymic disorder: Secondary | ICD-10-CM | POA: Insufficient documentation

## 2015-08-04 LAB — RAPID URINE DRUG SCREEN, HOSP PERFORMED
Amphetamines: NOT DETECTED
Barbiturates: NOT DETECTED
Benzodiazepines: NOT DETECTED
COCAINE: NOT DETECTED
OPIATES: POSITIVE — AB
Tetrahydrocannabinol: NOT DETECTED

## 2015-08-04 MED ORDER — ONDANSETRON HCL 4 MG PO TABS: 4.0000 mg | ORAL_TABLET | Freq: Three times a day (TID) | ORAL | Status: DC | PRN

## 2015-08-04 MED ORDER — DICYCLOMINE HCL 20 MG PO TABS: 20.0000 mg | ORAL_TABLET | Freq: Four times a day (QID) | ORAL | Status: DC | PRN

## 2015-08-04 MED ORDER — CLONIDINE HCL 0.1 MG PO TABS
0.1000 mg | ORAL_TABLET | Freq: Two times a day (BID) | ORAL | Status: DC
  Administered 2015-08-04: 0.1 mg via ORAL
  Filled 2015-08-04: qty 1

## 2015-08-04 MED ORDER — GABAPENTIN 100 MG PO CAPS
200.0000 mg | ORAL_CAPSULE | Freq: Three times a day (TID) | ORAL | Status: DC
  Administered 2015-08-04 – 2015-08-08 (×12): 200 mg via ORAL
  Filled 2015-08-04 (×16): qty 2

## 2015-08-04 MED ORDER — NICOTINE 21 MG/24HR TD PT24
21.0000 mg | MEDICATED_PATCH | Freq: Every day | TRANSDERMAL | Status: DC
  Administered 2015-08-04 (×2): 21 mg via TRANSDERMAL
  Filled 2015-08-04: qty 1

## 2015-08-04 MED ORDER — ZOLPIDEM TARTRATE 5 MG PO TABS
5.0000 mg | ORAL_TABLET | Freq: Every evening | ORAL | Status: DC | PRN
  Administered 2015-08-04: 5 mg via ORAL
  Filled 2015-08-04: qty 1

## 2015-08-04 MED ORDER — MAGNESIUM HYDROXIDE 400 MG/5ML PO SUSP: 30.0000 mL | Freq: Every day | ORAL | Status: DC | PRN

## 2015-08-04 MED ORDER — HYDROXYZINE HCL 25 MG PO TABS
25.0000 mg | ORAL_TABLET | Freq: Four times a day (QID) | ORAL | Status: DC | PRN
  Administered 2015-08-04: 25 mg via ORAL
  Filled 2015-08-04: qty 1

## 2015-08-04 MED ORDER — LOPERAMIDE HCL 2 MG PO CAPS: 2.0000 mg | ORAL_CAPSULE | ORAL | Status: DC | PRN

## 2015-08-04 MED ORDER — ALUM & MAG HYDROXIDE-SIMETH 200-200-20 MG/5ML PO SUSP: 30.0000 mL | ORAL | Status: DC | PRN

## 2015-08-04 MED ORDER — NAPROXEN 500 MG PO TABS: 500.0000 mg | ORAL_TABLET | Freq: Two times a day (BID) | ORAL | Status: DC | PRN

## 2015-08-04 MED ORDER — METHOCARBAMOL 500 MG PO TABS
500.0000 mg | ORAL_TABLET | Freq: Three times a day (TID) | ORAL | Status: DC | PRN
  Administered 2015-08-04: 500 mg via ORAL
  Filled 2015-08-04: qty 1

## 2015-08-04 MED ORDER — ONDANSETRON 4 MG PO TBDP: 4.0000 mg | ORAL_TABLET | Freq: Four times a day (QID) | ORAL | Status: DC | PRN

## 2015-08-04 MED ORDER — MENTHOL 3 MG MT LOZG: 1.0000 | LOZENGE | OROMUCOSAL | Status: DC | PRN

## 2015-08-04 MED ORDER — CLONIDINE HCL 0.1 MG PO TABS: 0.1000 mg | ORAL_TABLET | Freq: Every day | ORAL | Status: DC

## 2015-08-04 MED ORDER — ACETAMINOPHEN 325 MG PO TABS
650.0000 mg | ORAL_TABLET | ORAL | Status: DC | PRN
  Administered 2015-08-04: 650 mg via ORAL
  Filled 2015-08-04: qty 2

## 2015-08-04 MED ORDER — NICOTINE 21 MG/24HR TD PT24
21.0000 mg | MEDICATED_PATCH | Freq: Every day | TRANSDERMAL | Status: DC
  Administered 2015-08-05 – 2015-08-08 (×4): 21 mg via TRANSDERMAL
  Filled 2015-08-04 (×5): qty 1

## 2015-08-04 MED ORDER — CITALOPRAM HYDROBROMIDE 10 MG PO TABS
10.0000 mg | ORAL_TABLET | Freq: Every day | ORAL | Status: DC
  Administered 2015-08-04: 10 mg via ORAL
  Filled 2015-08-04: qty 1

## 2015-08-04 MED ORDER — METHOCARBAMOL 500 MG PO TABS
500.0000 mg | ORAL_TABLET | Freq: Three times a day (TID) | ORAL | Status: DC | PRN
  Administered 2015-08-04 – 2015-08-07 (×6): 500 mg via ORAL
  Filled 2015-08-04 (×6): qty 1

## 2015-08-04 MED ORDER — GABAPENTIN 100 MG PO CAPS
200.0000 mg | ORAL_CAPSULE | Freq: Three times a day (TID) | ORAL | Status: DC
  Administered 2015-08-04: 200 mg via ORAL
  Filled 2015-08-04 (×2): qty 2

## 2015-08-04 MED ORDER — DICYCLOMINE HCL 20 MG PO TABS
20.0000 mg | ORAL_TABLET | Freq: Four times a day (QID) | ORAL | Status: DC | PRN
  Administered 2015-08-04: 20 mg via ORAL
  Filled 2015-08-04: qty 1

## 2015-08-04 MED ORDER — CITALOPRAM HYDROBROMIDE 10 MG PO TABS
10.0000 mg | ORAL_TABLET | Freq: Every day | ORAL | Status: DC
  Administered 2015-08-05 – 2015-08-08 (×4): 10 mg via ORAL
  Filled 2015-08-04 (×5): qty 1

## 2015-08-04 MED ORDER — CLONIDINE HCL 0.1 MG PO TABS
0.1000 mg | ORAL_TABLET | Freq: Four times a day (QID) | ORAL | Status: AC
  Administered 2015-08-04 – 2015-08-05 (×6): 0.1 mg via ORAL
  Filled 2015-08-04 (×5): qty 1

## 2015-08-04 MED ORDER — CLONIDINE HCL 0.1 MG PO TABS: 0.1000 mg | ORAL_TABLET | ORAL | Status: DC

## 2015-08-04 MED ORDER — ACETAMINOPHEN 325 MG PO TABS: 650.0000 mg | ORAL_TABLET | ORAL | Status: DC | PRN

## 2015-08-04 MED ORDER — CLONIDINE HCL 0.1 MG PO TABS
0.1000 mg | ORAL_TABLET | ORAL | Status: DC
  Administered 2015-08-07 – 2015-08-08 (×2): 0.1 mg via ORAL
  Filled 2015-08-04 (×4): qty 1

## 2015-08-04 MED ORDER — CLONIDINE HCL 0.1 MG PO TABS
0.1000 mg | ORAL_TABLET | Freq: Four times a day (QID) | ORAL | Status: DC
  Administered 2015-08-04: 0.1 mg via ORAL
  Filled 2015-08-04: qty 1

## 2015-08-04 MED ORDER — ZOLPIDEM TARTRATE 5 MG PO TABS
5.0000 mg | ORAL_TABLET | Freq: Every evening | ORAL | Status: DC | PRN
Start: 2015-08-04 — End: 2015-08-08
  Administered 2015-08-04 – 2015-08-07 (×4): 5 mg via ORAL
  Filled 2015-08-04 (×5): qty 1

## 2015-08-04 MED ORDER — ACETAMINOPHEN 325 MG PO TABS
650.0000 mg | ORAL_TABLET | Freq: Four times a day (QID) | ORAL | Status: DC | PRN
  Administered 2015-08-05: 650 mg via ORAL
  Filled 2015-08-04: qty 2

## 2015-08-04 MED ORDER — GUAIFENESIN 100 MG/5ML PO SOLN: 5.0000 mL | Freq: Four times a day (QID) | ORAL | Status: DC | PRN

## 2015-08-04 MED ORDER — CLONIDINE HCL 0.1 MG PO TABS
0.1000 mg | ORAL_TABLET | Freq: Every day | ORAL | Status: DC
  Filled 2015-08-04 (×2): qty 1

## 2015-08-04 MED ORDER — NAPROXEN 500 MG PO TABS
500.0000 mg | ORAL_TABLET | Freq: Two times a day (BID) | ORAL | Status: DC | PRN
  Administered 2015-08-05 – 2015-08-07 (×3): 500 mg via ORAL
  Filled 2015-08-04 (×3): qty 1

## 2015-08-04 MED ORDER — HYDROXYZINE HCL 25 MG PO TABS
25.0000 mg | ORAL_TABLET | Freq: Four times a day (QID) | ORAL | Status: DC | PRN
  Administered 2015-08-04 – 2015-08-08 (×7): 25 mg via ORAL
  Filled 2015-08-04: qty 10
  Filled 2015-08-04 (×7): qty 1

## 2015-08-04 NOTE — ED Notes (Signed)
Notified NP Lord and Dr. Effie ShyWentz regarding Hcg level 6.5. Asked patient if she could be pregnant and she states she has has an IUD in for years.  Dr. Effie ShyWentz was not concerned about level.  Continued with transfer to OBS unit bed #5. Report given to Kimberlee NearingAndrea B., RN.  Informed patient she may want to have a blood test done to confirm negative pregnancy.

## 2015-08-04 NOTE — ED Notes (Signed)
Patient complaining of withdrawal symptoms such as muscle aches, chilling, runny nose and irritability.  She has been resting quietly most of morning.  She denies any SI/HI/AVH.  [facility].  She relapsed on heroin after 10 months of sobriety.  Patient was given multiple prns for withdrawal symptoms.  She is on the clonidine protocol.  BP stable. Patient may be transferred to OBS which she was agreeable with.

## 2015-08-04 NOTE — BH Assessment (Signed)
[facility] Assessment Progress Note   Per Thedore MinsMojeed Akintayo, MD, this pt would benefit from [facility] at this [facility].  Traci Heinrichina Tate, RN, [facility] to Obs 5.  Pt has signed Voluntary Admission and Consent for Treatment, as well as Consent to Release Information to no one, and signed forms [facility].  Pt's nurse, Traci [name], has been notified, and agrees to send original paperwork along with pt via [facility], and to call report to [phone] or [phone].  Traci Canninghomas Emi Lymon, MA Triage Specialist [phone]

## 2015-08-04 NOTE — H&P (Signed)
Psychiatric [facility]Observation Unit Assessment Adult  Patient Identification: Traci [name] MRN:  [id number] Date of Evaluation:  08/04/2015 Chief Complaint:  Patient states "I relapsed lost my job and my place to live."  Principal Diagnosis: Substance induced mood disorder ([place]) Diagnosis:   Patient Active Problem List   Diagnosis Date Noted  . Heroin dependence ([place]) [F11.20] 08/04/2015  . Moderate episode of recurrent major depressive disorder ([place]) [F33.1]   . Substance induced mood disorder ([place]) [F19.94] 05/30/2015  . Major depression, recurrent ([place]) [F33.9] 05/30/2015  . Polysubstance abuse [F19.10] 05/30/2015   History of Present Illness::  Traci [name] is a 30 yo female who presented to the [facility] with depression and heroin relapse. She had been clean for eight months until two months ago when she relapsed.Patient reports this resulted in her being kicked out of her [place] and is now homeless.  Yesterday, she started having passive suicidal ideations but no plan.She denies homicidal ideations and hallucinations.Traci [name] reports that she has attempted suicide twice before -- once at age 76 (overdose on "some pills") and once in her 70s (not sure of the date), at which time she threatened to kill herself with a weapon. Patient stated that she is currently suicidal with a plan to overdose and she is worried that if discharged, she would harm herself. In addition to suicidal ideation, Patient endorsed tearfulness, persistent and long-standing sadness, feelings of hopelessness, poor appetite, and insomnia. She reported first using opioids at age 50 ("pills") and also use of heroin.Patient was last inpatient at [facility] in April of 2017 for similar symptoms.   Associated Signs/Symptoms: Depression Symptoms:  depressed mood, anhedonia, hypersomnia, fatigue, feelings of worthlessness/guilt, hopelessness, recurrent thoughts of death, suicidal thoughts with specific  plan, anxiety, disturbed sleep, decreased appetite, (Hypo) Manic Symptoms:  Labiality of Mood, Anxiety Symptoms:  Excessive Worry, Social Anxiety, Psychotic Symptoms:  denies PTSD Symptoms: Had a traumatic exposure:  has gone through things but does not relive them Total Time spent with patient: 45 minutes  Past Psychiatric History: heroin dependence, depression  Is the patient at risk to self? Yes.    Has the patient been a risk to self in the past 6 months? No.  Has the patient been a risk to self within the distant past? Yes.    Is the patient a risk to others? No.  Has the patient been a risk to others in the past 6 months? No.  Has the patient been a risk to others within the distant past? No.   Prior Inpatient Therapy:   Prior Outpatient Therapy:   At [address], [place] in her 64's last year [facility] days Alcohol Screening:   Substance Abuse History in the last 12 months:  Yes.   Consequences of Substance Abuse: Withdrawal Symptoms:   Cramps Diaphoresis back pain anorexia  Drug related charge dismissed  Previous Psychotropic Medications: Yes Prozac Wellbutrin Zoloft Paxil Lexapro Cymbalta Tegretol Depakote Neurontin Seroquel  Psychological Evaluations: No  Past Medical History:  Past Medical History  Diagnosis Date  . ADD (attention deficit disorder)   . Depression   . Substance abuse    History reviewed. No pertinent past surgical history. Family History: History reviewed. No pertinent family history. Family Psychiatric  History:Depression and anxiety in her family, alcohol drugs Tobacco Screening: Reports that she smokes one pack per day  Social History:  History  Alcohol Use No     History  Drug Use  . Yes  . 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 Zinc Oxide Itching and Swelling    Lab Results:  Results for orders placed or performed during the hospital encounter of 08/03/15 (from the past 48 hour(s))  Comprehensive metabolic panel     Status: Abnormal   Collection Time: 08/03/15 11:00 PM  Result Value Ref Range   Sodium 137 135 - 145 mmol/L   Potassium 3.6 3.5 - 5.1 mmol/L   Chloride 107 101 - 111 mmol/L   CO2 21 (L) 22 - 32 mmol/L   Glucose, Bld 110 (H) 65 - 99 mg/dL   BUN 9 6 - 20 mg/dL   Creatinine, Ser 0.56 0.44 - 1.00 mg/dL   Calcium 9.2 8.9 - 10.3 mg/dL   Total Protein 7.3 6.5 - 8.1 g/dL   Albumin 4.0 3.5 - 5.0 g/dL   AST 17 15 - 41 U/L   ALT 21 14 - 54 U/L   Alkaline Phosphatase 83 38 - 126 U/L   Total Bilirubin 0.8 0.3 - 1.2 mg/dL   GFR calc non Af Amer >60 >60 mL/min   GFR calc Af Amer >60 >60 mL/min    Comment: (NOTE) The eGFR has been calculated using the CKD EPI equation. This calculation has not been validated in all clinical situations. eGFR's persistently <60 mL/min signify possible Chronic Kidney Disease.    Anion gap 9 5 - 15  Ethanol     Status: None   Collection Time: 08/03/15 11:00 PM  Result Value Ref Range   Alcohol, Ethyl (B) <5 <5 mg/dL    Comment:        LOWEST DETECTABLE LIMIT FOR SERUM ALCOHOL IS 5 mg/dL FOR MEDICAL PURPOSES ONLY   Salicylate level     Status: None   Collection Time: 08/03/15 11:00 PM  Result Value Ref Range   Salicylate Lvl <8.1 2.8 - 30.0 mg/dL  Acetaminophen level     Status: Abnormal   Collection Time: 08/03/15 11:00 PM  Result Value Ref Range   Acetaminophen (Tylenol), Serum <10 (L) 10 - 30 ug/mL    Comment:        THERAPEUTIC CONCENTRATIONS VARY SIGNIFICANTLY. A RANGE OF 10-30 ug/mL MAY BE AN EFFECTIVE CONCENTRATION FOR MANY PATIENTS. HOWEVER, SOME ARE BEST TREATED AT CONCENTRATIONS OUTSIDE THIS RANGE. ACETAMINOPHEN CONCENTRATIONS >150 ug/mL AT 4 HOURS AFTER INGESTION AND >50 ug/mL AT 12 HOURS AFTER INGESTION ARE OFTEN ASSOCIATED WITH TOXIC REACTIONS.   cbc     Status: Abnormal    Collection Time: 08/03/15 11:00 PM  Result Value Ref Range   WBC 10.7 (H) 4.0 - 10.5 K/uL   RBC 4.35 3.87 - 5.11 MIL/uL   Hemoglobin 13.5 12.0 - 15.0 g/dL   HCT 38.0 36.0 - 46.0 %   MCV 87.4 78.0 - 100.0 fL   MCH 31.0 26.0 - 34.0 pg   MCHC 35.5 30.0 - 36.0 g/dL   RDW 12.7 11.5 - 15.5 %   Platelets 256 150 - 400 K/uL  hCG, quantitative, pregnancy     Status: None   Collection Time: 08/03/15 11:00 PM  Result Value Ref Range   hCG, Beta Chain, Quant, S <1 <5 mIU/mL    Comment:          GEST. AGE  CONC.  (mIU/mL)   <=1 WEEK        5 - 50     2 WEEKS       50 - 500     3 WEEKS       100 - 10,000     4 WEEKS     1,000 - 30,000     5 WEEKS     3,500 - 115,000   6-8 WEEKS     12,000 - 270,000    12 WEEKS     15,000 - 220,000        FEMALE AND NON-PREGNANT FEMALE:     LESS THAN 5 mIU/mL   I-Stat beta hCG blood, ED     Status: Abnormal   Collection Time: 08/03/15 11:06 PM  Result Value Ref Range   I-stat hCG, quantitative 6.5 (H) <5 mIU/mL   Comment 3            Comment:   GEST. AGE      CONC.  (mIU/mL)   <=1 WEEK        5 - 50     2 WEEKS       50 - 500     3 WEEKS       100 - 10,000     4 WEEKS     1,000 - 30,000        FEMALE AND NON-PREGNANT FEMALE:     LESS THAN 5 mIU/mL   Rapid urine drug screen (hospital performed)     Status: Abnormal   Collection Time: 08/04/15 12:44 AM  Result Value Ref Range   Opiates POSITIVE (A) NONE DETECTED   Cocaine NONE DETECTED NONE DETECTED   Benzodiazepines NONE DETECTED NONE DETECTED   Amphetamines NONE DETECTED NONE DETECTED   Tetrahydrocannabinol NONE DETECTED NONE DETECTED   Barbiturates NONE DETECTED NONE DETECTED    Comment:        DRUG SCREEN FOR MEDICAL PURPOSES ONLY.  IF CONFIRMATION IS NEEDED FOR ANY PURPOSE, NOTIFY LAB WITHIN 5 DAYS.        LOWEST DETECTABLE LIMITS FOR URINE DRUG SCREEN Drug Class       Cutoff (ng/mL) Amphetamine      1000 Barbiturate      200 Benzodiazepine   570 Tricyclics       177 Opiates           300 Cocaine          300 THC              50     Blood Alcohol level:  Lab Results  Component Value Date   ETH <5 08/03/2015   ETH <5 93/90/3009    Metabolic Disorder Labs:  No results found for: HGBA1C, MPG No results found for: PROLACTIN No results found for: CHOL, TRIG, HDL, CHOLHDL, VLDL, LDLCALC  Current Medications: Current Facility-Administered Medications  Medication Dose Route Frequency Provider Last Rate Last Dose  . acetaminophen (TYLENOL) tablet 650 mg  650 mg Oral Q6H PRN Patrecia Pour, NP      . alum & mag hydroxide-simeth (MAALOX/MYLANTA) 200-200-20 MG/5ML suspension 30 mL  30 mL Oral Q4H PRN Patrecia Pour, NP      . Derrill Memo ON 08/05/2015] citalopram (CELEXA) tablet 10 mg  10 mg Oral Daily Patrecia Pour, NP      . cloNIDine (CATAPRES) tablet 0.1 mg  0.1 mg Oral QID Patrecia Pour, NP       Followed  by  . Derrill Memo ON 08/06/2015] cloNIDine (CATAPRES) tablet 0.1 mg  0.1 mg Oral BH-qamhs Patrecia Pour, NP       Followed by  . [START ON 08/09/2015] cloNIDine (CATAPRES) tablet 0.1 mg  0.1 mg Oral QAC breakfast Patrecia Pour, NP      . dicyclomine (BENTYL) tablet 20 mg  20 mg Oral Q6H PRN Patrecia Pour, NP      . gabapentin (NEURONTIN) capsule 200 mg  200 mg Oral TID Patrecia Pour, NP   200 mg at 08/04/15 1408  . hydrOXYzine (ATARAX/VISTARIL) tablet 25 mg  25 mg Oral Q6H PRN Patrecia Pour, NP      . loperamide (IMODIUM) capsule 2-4 mg  2-4 mg Oral PRN Patrecia Pour, NP      . magnesium hydroxide (MILK OF MAGNESIA) suspension 30 mL  30 mL Oral Daily PRN Patrecia Pour, NP      . methocarbamol (ROBAXIN) tablet 500 mg  500 mg Oral Q8H PRN Patrecia Pour, NP      . naproxen (NAPROSYN) tablet 500 mg  500 mg Oral BID PRN Patrecia Pour, NP      . Derrill Memo ON 08/05/2015] nicotine (NICODERM CQ - dosed in mg/24 hours) patch 21 mg  21 mg Transdermal Daily Patrecia Pour, NP      . ondansetron [facility]) tablet 4 mg  4 mg Oral Q8H PRN Patrecia Pour, NP      . ondansetron  (ZOFRAN-ODT) disintegrating tablet 4 mg  4 mg Oral Q6H PRN Patrecia Pour, NP      . zolpidem (AMBIEN) tablet 5 mg  5 mg Oral QHS PRN Patrecia Pour, NP       PTA Medications: Prescriptions prior to admission  Medication Sig Dispense Refill Last Dose  . acetaminophen (TYLENOL) 325 MG tablet Take 2 tablets (650 mg total) by mouth every 6 (six) hours as needed for moderate pain.   unknown  . gabapentin (NEURONTIN) 100 MG capsule Take 1 capsule (100 mg total) by mouth 3 (three) times daily. For agitation (Patient not taking: Reported on 08/03/2015) 90 capsule 0 Not Taking at Unknown time  . hydrOXYzine (ATARAX/VISTARIL) 50 MG tablet Take 1 tablet (50 mg total) by mouth every 6 (six) hours as needed for anxiety. (Patient not taking: Reported on 08/03/2015) 60 tablet 0 Not Taking at Unknown time  . nicotine (NICODERM CQ - DOSED IN MG/24 HOURS) 21 mg/24hr patch Place 1 patch (21 mg total) onto the skin daily. For nicotine addiction (Patient not taking: Reported on 08/03/2015) 28 patch 0 Not Taking at Unknown time  . traZODone (DESYREL) 100 MG tablet Take 1 tablet (100 mg total) by mouth at bedtime as needed and may repeat dose one time if needed for sleep. (Patient not taking: Reported on 08/03/2015) 60 tablet 0 Not Taking at Unknown time    Musculoskeletal: Strength & Muscle Tone: within normal limits Gait & Station: normal Patient leans: normal  Psychiatric Specialty Exam: Physical Exam  Review of Systems  Constitutional: Positive for malaise/fatigue.  HENT: Negative.   Eyes: Negative.   Respiratory: Negative.        Pack a day  Cardiovascular: Negative.   Gastrointestinal: Negative.   Genitourinary: Negative.   Musculoskeletal: Positive for myalgias.  Skin: Negative.   Endo/Heme/Allergies: Negative.   Psychiatric/Behavioral: Positive for depression, suicidal ideas and substance abuse. Negative for hallucinations and memory loss. The patient is nervous/anxious and has insomnia.  Blood  pressure 118/66, pulse 86, temperature 98.5 F (36.9 C), temperature source Oral, resp. rate 18, height _0  (1.626 m), weight 68.947 kg (152 lb), last menstrual period 07/23/2015, SpO2 99 %.Body mass index is 26.08 kg/(m^2).  General Appearance: Fairly Groomed  Engineer, water::  Fair  Speech:  Clear and Coherent  Volume:  fluctuates  Mood:  Anxious, Depressed and Dysphoric  Affect:  Restricted  Thought Process:  Coherent and Goal Directed  Orientation:  Full (Time, Place, and Person)  Thought Content:  symptoms events worries concerns  Suicidal Thoughts:  Yes.  with intent/plan  Homicidal Thoughts:  No  Memory:  Immediate;   Fair Recent;   Fair Remote;   Fair  Judgement:  Fair  Insight:  Present  Psychomotor Activity:  Restlessness  Concentration:  Fair  Recall:  [employer] of Knowledge:Fair  Language: Fair  Akathisia:  No  Handed:  Right  AIMS (if indicated):     Assets:  Communication Skills Desire for Improvement Leisure Time Social Support  ADL's:  Intact  Cognition: WNL  Sleep:        Treatment Plan Summary: Daily contact with patient to assess and evaluate symptoms and progress in treatment and Medication management Supportive approach/coping skills Substance abuse; detox with clonidine protocol for opiate abuse, work a relapse prevention plan Anxiety; Neurontin 200 mg TID/Vistaril 50 mg  Insomnia; Ambien 5 mg HS Depression: Celexa 10 mg daily for depression Work with CBT/mindfulness Observation Level/Precautions:  Continuous Observation  Laboratory:  As per the ED  Psychotherapy:  Individual  Medications:  See above   Consultations:  None  Discharge Concerns:  Continued substance abuse, housing problems   Estimated LOS: 24-48 hours  Other:  Explore options for substance abuse treatment    Matea Stanard, Mickel Baas, NP 6/13/20173:15 PM

## 2015-08-04 NOTE — Progress Notes (Signed)
Traci DieterHannah [name]  Is in bed 6 at shift change sleeping on and off and watching TV.  Pt denies pain with the exception of diffuse chronic pain related to her heroin withdrawal.  Pt is quiet but friendly and a bit guarded.  Pt did not expect to be on a unit without bedrooms etc.  Pt denies and SI, HI or AVH.  Pt hx of two suicide attempts at 15 and sometimes in her [age]'s.  Pt asking for snacks. Pt given snacks, ice cream and soda.  Pt continuously monitored on unit for safety except when in bathroom. Pt watching TV and eating snacks.  Pt remains safe on unit.

## 2015-08-04 NOTE — ED Notes (Signed)
Patient discharged home per provider's note.  She is pleasant and cooperative.  Patient transported via [facility] transportation.  Patient's paperwork and belongings sent with her.  Patient denies SI/HI/AVH.

## 2015-08-04 NOTE — ED Notes (Signed)
[id number] Expected date: 08/04/15 Expected time: 12:53 AM Means of arrival:  Comments: Clotilde DieterHannah Blanda

## 2015-08-04 NOTE — Consult Note (Signed)
[facility] Face-to-Face Psychiatry Consult   Reason for Consult:  Heroin relapse with suicidal ideations Referring Physician:  EDP Patient Identification: Traci [name] MRN:  [id number] Principal Diagnosis: Substance induced mood disorder (HCC) Diagnosis:   Patient Active Problem List   Diagnosis Date Noted  . Heroin dependence (HCC) [F11.20] 08/04/2015    Priority: High  . Substance induced mood disorder (HCC) [F19.94] 05/30/2015    Priority: High  . Moderate episode of recurrent major depressive disorder (HCC) [F33.1]   . Major depression, recurrent (HCC) [F33.9] 05/30/2015  . Polysubstance abuse [F19.10] 05/30/2015    Total Time spent with patient: 45 minutes  Subjective:   Traci [name] is a 30 y.o. female patient admitted to [facility] Observation Unit for heroin dependence and passive suicidal ideations.  HPI:  30 yo female who presented to the ED with depression and heroin relapse.  She had been clean for 8 months until 2 months ago when she relapsed.  Yesterday, she started having passive suicidal ideations but no plan.  Denies homicidal ideations and hallucinations.    Past Psychiatric History: heroin dependence, depression  Risk to Self: Is patient at risk for suicide?: No Risk to Others:  none Prior Inpatient Therapy:  yes, [facility] Prior Outpatient Therapy:  none  Past Medical History:  Past Medical History  Diagnosis Date  . ADD (attention deficit disorder)   . Depression   . Substance abuse    History reviewed. No pertinent past surgical history. Family History: History reviewed. No pertinent family history. Family Psychiatric  History: none Social History:  History  Alcohol Use No     History  Drug Use  . Yes  . Special: Heroin    Social History   Social History  . Marital Status: Single    Spouse Name: N/A  . Number of Children: N/A  . Years of Education: N/A   Social History Main Topics  . Smoking status: Current Every Day Smoker -- 1.00 packs/day for 10 years     Types: Cigarettes  . Smokeless tobacco: None  . Alcohol Use: No  . Drug Use: Yes    Special: Heroin  . Sexual Activity: Not Currently    Birth Control/ Protection: IUD   Other Topics Concern  . None   Social History Narrative   Additional Social History:    Allergies:   Allergies  Allergen Reactions  . Zinc Oxide Itching and Swelling    Labs:  Results for orders placed or performed during the hospital encounter of 08/03/15 (from the past 48 hour(s))  Comprehensive metabolic panel     Status: Abnormal   Collection Time: 08/03/15 11:00 PM  Result Value Ref Range   Sodium 137 135 - 145 mmol/L   Potassium 3.6 3.5 - 5.1 mmol/L   Chloride 107 101 - 111 mmol/L   CO2 21 (L) 22 - 32 mmol/L   Glucose, Bld 110 (H) 65 - 99 mg/dL   BUN 9 6 - 20 mg/dL   Creatinine, Ser 5.20 0.44 - 1.00 mg/dL   Calcium 9.2 8.9 - 50.5 mg/dL   Total Protein 7.3 6.5 - 8.1 g/dL   Albumin 4.0 3.5 - 5.0 g/dL   AST 17 15 - 41 U/L   ALT 21 14 - 54 U/L   Alkaline Phosphatase 83 38 - 126 U/L   Total Bilirubin 0.8 0.3 - 1.2 mg/dL   GFR calc non Af Amer >60 >60 mL/min   GFR calc Af Amer >60 >60 mL/min    Comment: (NOTE)  The eGFR has been calculated using the CKD EPI equation. This calculation has not been validated in all clinical situations. eGFR's persistently <60 mL/min signify possible Chronic Kidney Disease.    Anion gap 9 5 - 15  Ethanol     Status: None   Collection Time: 08/03/15 11:00 PM  Result Value Ref Range   Alcohol, Ethyl (B) <5 <5 mg/dL    Comment:        LOWEST DETECTABLE LIMIT FOR SERUM ALCOHOL IS 5 mg/dL FOR MEDICAL PURPOSES ONLY   Salicylate level     Status: None   Collection Time: 08/03/15 11:00 PM  Result Value Ref Range   Salicylate Lvl <1.2 2.8 - 30.0 mg/dL  Acetaminophen level     Status: Abnormal   Collection Time: 08/03/15 11:00 PM  Result Value Ref Range   Acetaminophen (Tylenol), Serum <10 (L) 10 - 30 ug/mL    Comment:        THERAPEUTIC CONCENTRATIONS  VARY SIGNIFICANTLY. A RANGE OF 10-30 ug/mL MAY BE AN EFFECTIVE CONCENTRATION FOR MANY PATIENTS. HOWEVER, SOME ARE BEST TREATED AT CONCENTRATIONS OUTSIDE THIS RANGE. ACETAMINOPHEN CONCENTRATIONS >150 ug/mL AT 4 HOURS AFTER INGESTION AND >50 ug/mL AT 12 HOURS AFTER INGESTION ARE OFTEN ASSOCIATED WITH TOXIC REACTIONS.   cbc     Status: Abnormal   Collection Time: 08/03/15 11:00 PM  Result Value Ref Range   WBC 10.7 (H) 4.0 - 10.5 K/uL   RBC 4.35 3.87 - 5.11 MIL/uL   Hemoglobin 13.5 12.0 - 15.0 g/dL   HCT 38.0 36.0 - 46.0 %   MCV 87.4 78.0 - 100.0 fL   MCH 31.0 26.0 - 34.0 pg   MCHC 35.5 30.0 - 36.0 g/dL   RDW 12.7 11.5 - 15.5 %   Platelets 256 150 - 400 K/uL  hCG, quantitative, pregnancy     Status: None   Collection Time: 08/03/15 11:00 PM  Result Value Ref Range   hCG, Beta Chain, Quant, S <1 <5 mIU/mL    Comment:          GEST. AGE      CONC.  (mIU/mL)   <=1 WEEK        5 - 50     2 WEEKS       50 - 500     3 WEEKS       100 - 10,000     4 WEEKS     1,000 - 30,000     5 WEEKS     3,500 - 115,000   6-8 WEEKS     12,000 - 270,000    12 WEEKS     15,000 - 220,000        FEMALE AND NON-PREGNANT FEMALE:     LESS THAN 5 mIU/mL   I-Stat beta hCG blood, ED     Status: Abnormal   Collection Time: 08/03/15 11:06 PM  Result Value Ref Range   I-stat hCG, quantitative 6.5 (H) <5 mIU/mL   Comment 3            Comment:   GEST. AGE      CONC.  (mIU/mL)   <=1 WEEK        5 - 50     2 WEEKS       50 - 500     3 WEEKS       100 - 10,000     4 WEEKS     1,000 - 30,000  FEMALE AND NON-PREGNANT FEMALE:     LESS THAN 5 mIU/mL   Rapid urine drug screen (hospital performed)     Status: Abnormal   Collection Time: 08/04/15 12:44 AM  Result Value Ref Range   Opiates POSITIVE (A) NONE DETECTED   Cocaine NONE DETECTED NONE DETECTED   Benzodiazepines NONE DETECTED NONE DETECTED   Amphetamines NONE DETECTED NONE DETECTED   Tetrahydrocannabinol NONE DETECTED NONE DETECTED    Barbiturates NONE DETECTED NONE DETECTED    Comment:        DRUG SCREEN FOR MEDICAL PURPOSES ONLY.  IF CONFIRMATION IS NEEDED FOR ANY PURPOSE, NOTIFY LAB WITHIN 5 DAYS.        LOWEST DETECTABLE LIMITS FOR URINE DRUG SCREEN Drug Class       Cutoff (ng/mL) Amphetamine      1000 Barbiturate      200 Benzodiazepine   656 Tricyclics       812 Opiates          300 Cocaine          300 THC              50     Current Facility-Administered Medications  Medication Dose Route Frequency Provider Last Rate Last Dose  . acetaminophen (TYLENOL) tablet 650 mg  650 mg Oral Q4H PRN Leonard Schwartz, MD   650 mg at 08/04/15 0100  . alum & mag hydroxide-simeth (MAALOX/MYLANTA) 200-200-20 MG/5ML suspension 30 mL  30 mL Oral PRN Leonard Schwartz, MD      . cloNIDine (CATAPRES) tablet 0.1 mg  0.1 mg Oral QID Patrecia Pour, NP   0.1 mg at 08/04/15 7517   Followed by  . [START ON 08/06/2015] cloNIDine (CATAPRES) tablet 0.1 mg  0.1 mg Oral BH-qamhs Patrecia Pour, NP       Followed by  . [START ON 08/08/2015] cloNIDine (CATAPRES) tablet 0.1 mg  0.1 mg Oral QAC breakfast Patrecia Pour, NP      . dicyclomine (BENTYL) tablet 20 mg  20 mg Oral Q6H PRN Patrecia Pour, NP   20 mg at 08/04/15 0927  . hydrOXYzine (ATARAX/VISTARIL) tablet 25 mg  25 mg Oral Q6H PRN Patrecia Pour, NP   25 mg at 08/04/15 0927  . loperamide (IMODIUM) capsule 2-4 mg  2-4 mg Oral PRN Patrecia Pour, NP      . methocarbamol (ROBAXIN) tablet 500 mg  500 mg Oral Q8H PRN Patrecia Pour, NP   500 mg at 08/04/15 0017  . naproxen (NAPROSYN) tablet 500 mg  500 mg Oral BID PRN Patrecia Pour, NP      . nicotine (NICODERM CQ - dosed in mg/24 hours) patch 21 mg  21 mg Transdermal Daily Leonard Schwartz, MD   21 mg at 08/04/15 0929  . ondansetron (ZOFRAN) tablet 4 mg  4 mg Oral Q8H PRN Leonard Schwartz, MD      . ondansetron (ZOFRAN-ODT) disintegrating tablet 4 mg  4 mg Oral Q6H PRN Patrecia Pour, NP      . zolpidem (AMBIEN) tablet 5 mg  5 mg Oral QHS PRN  Leonard Schwartz, MD   5 mg at 08/04/15 0100   Current Outpatient Prescriptions  Medication Sig Dispense Refill  . acetaminophen (TYLENOL) 325 MG tablet Take 2 tablets (650 mg total) by mouth every 6 (six) hours as needed for moderate pain.    Marland Kitchen gabapentin (NEURONTIN) 100 MG capsule Take 1 capsule (100 mg total) by mouth 3 (three)  times daily. For agitation (Patient not taking: Reported on 08/03/2015) 90 capsule 0  . hydrOXYzine (ATARAX/VISTARIL) 50 MG tablet Take 1 tablet (50 mg total) by mouth every 6 (six) hours as needed for anxiety. (Patient not taking: Reported on 08/03/2015) 60 tablet 0  . nicotine (NICODERM CQ - DOSED IN MG/24 HOURS) 21 mg/24hr patch Place 1 patch (21 mg total) onto the skin daily. For nicotine addiction (Patient not taking: Reported on 08/03/2015) 28 patch 0  . traZODone (DESYREL) 100 MG tablet Take 1 tablet (100 mg total) by mouth at bedtime as needed and may repeat dose one time if needed for sleep. (Patient not taking: Reported on 08/03/2015) 60 tablet 0    Musculoskeletal: Strength & Muscle Tone: within normal limits Gait & Station: normal Patient leans: N/A  Psychiatric Specialty Exam: Physical Exam  Constitutional: She is oriented to person, place, and time. She appears well-developed and well-nourished.  HENT:  Head: Normocephalic.  Neck: Normal range of motion.  Respiratory: Effort normal.  GI: Soft.  Musculoskeletal: Normal range of motion.  Neurological: She is alert and oriented to person, place, and time.  Skin: Skin is warm and dry.  Psychiatric: Her speech is normal and behavior is normal. Judgment and thought content normal. Cognition and memory are normal. She exhibits a depressed mood.    Review of Systems  Constitutional: Negative.   HENT: Negative.   Eyes: Negative.   Respiratory: Negative.   Cardiovascular: Negative.   Gastrointestinal: Negative.   Genitourinary: Negative.   Musculoskeletal: Negative.   Skin: Negative.   Neurological:  Negative.   Endo/Heme/Allergies: Negative.   Psychiatric/Behavioral: Positive for depression and substance abuse.    Blood pressure 117/65, pulse 62, temperature 98.2 F (36.8 C), temperature source Oral, resp. rate 18, height '5\' 4"'$  (1.626 m), weight 72.122 kg (159 lb), last menstrual period 07/23/2015, SpO2 100 %.Body mass index is 27.28 kg/(m^2).  General Appearance: Disheveled  Eye Contact:  Fair  Speech:  Normal Rate  Volume:  Normal  Mood:  Depressed  Affect:  Congruent  Thought Process:  Coherent  Orientation:  Full (Time, Place, and Person)  Thought Content:  WDL  Suicidal Thoughts:  No  Homicidal Thoughts:  No  Memory:  Immediate;   Fair Recent;   Fair Remote;   Fair  Judgement:  Fair  Insight:  Fair  Psychomotor Activity:  Decreased  Concentration:  Concentration: Fair and Attention Span: Fair  Recall:  [employer] of Knowledge:  Fair  Language:  Fair  Akathisia:  No  Handed:  Right  AIMS (if indicated):     Assets:  Leisure Time Physical Health Resilience  ADL's:  Intact  Cognition:  WNL  Sleep:        Treatment Plan Summary: Daily contact with patient to assess and evaluate symptoms and progress in treatment, Medication management and Plan substance induced mood disorder:  -Crisis stabilization -Medication management:  Opiate withdrawal protocol implemented.  Started gabapentin 200 mg TID for withdrawal symptoms and Celexa 10 mg daily for depression -Individual and substance abuse counseling  Disposition: Supportive therapy provided about ongoing stressors.  Waylan Boga, NP 08/04/2015 9:52 AM Patient seen face-to-face for psychiatric evaluation, chart reviewed and case discussed with the physician extender and developed treatment plan. Reviewed the information documented and agree with the treatment plan. Corena Pilgrim, MD

## 2015-08-04 NOTE — Progress Notes (Signed)
Patient ID: Traci DieterHannah [name], female   DOB: 02/14/1986, 30 y.o.   MRN: [id number]  [facility] [place] [facility], states that she recently relapsed on heroin x 1 month ago after 10 month sobriety, denies SI/HI/AVH, states that "I was self medicating because I need to be on something for depression", reports anxiety and depression, decreased appetite and "sleeping a lot", withdrawal symptoms of "feeling hot and cold", currently the patient is homeless has been staying at friend's home but is unsure if she can return there after discharge, would like to go into a residential treatment facility from here, states that she was in [facility] in July of 2016 and was in [facility] in January and March of 2016, skin and contraband search done, skin intact, no contraband found, oriented pt to unit and rules, pleasant but anxious during arrival process, Pt's belongings were placed and secured in [facility] [address].

## 2015-08-04 NOTE — ED Notes (Signed)
Patient denies SI, HI. Reports withdraw symptoms. Reports last using around noon 6/12. Rates anxiety 7/10, feelings of depression 7/10. Reports that she has been "sleeping too much". States she is feeling "aches, chills, sweats, yawning, and restless leg".   Encouragement offered. Oriented to the unit. Environment adjusted. Clonidine and PRNs given.  q 15 safety checks in place.

## 2015-08-05 DIAGNOSIS — F331 Major depressive disorder, recurrent, moderate: Secondary | ICD-10-CM | POA: Diagnosis present

## 2015-08-05 DIAGNOSIS — Z818 Family history of other mental and behavioral disorders: Secondary | ICD-10-CM | POA: Diagnosis not present

## 2015-08-05 DIAGNOSIS — F411 Generalized anxiety disorder: Secondary | ICD-10-CM | POA: Diagnosis present

## 2015-08-05 DIAGNOSIS — Z590 Homelessness: Secondary | ICD-10-CM | POA: Diagnosis not present

## 2015-08-05 DIAGNOSIS — F1124 Opioid dependence with opioid-induced mood disorder: Secondary | ICD-10-CM | POA: Diagnosis present

## 2015-08-05 DIAGNOSIS — G4710 Hypersomnia, unspecified: Secondary | ICD-10-CM | POA: Diagnosis present

## 2015-08-05 DIAGNOSIS — G4700 Insomnia, unspecified: Secondary | ICD-10-CM | POA: Diagnosis present

## 2015-08-05 DIAGNOSIS — R45851 Suicidal ideations: Secondary | ICD-10-CM | POA: Diagnosis present

## 2015-08-05 DIAGNOSIS — F17210 Nicotine dependence, cigarettes, uncomplicated: Secondary | ICD-10-CM | POA: Diagnosis present

## 2015-08-05 NOTE — [facility] Counselor (Signed)
Pt wants to receive treatment via inpatient. AC (Tori) was made aware of this . Pt would benefit from receiving inpatient services on the 300 hall here at [facility]. Pt will be further evaluated on this morning to determine final disposition   Wyndi Northrup McNeil, MA OBS Counselor 

## 2015-08-05 NOTE — Tx Team (Signed)
Initial Interdisciplinary Treatment Plan   PATIENT STRESSORS: Substance abuse Mental health concerns   PATIENT STRENGTHS: Ability for insight Average or above average intelligence Communication skills General fund of knowledge Supportive family/friends   PROBLEM LIST: Problem List/Patient Goals Date to be addressed Date deferred Reason deferred Estimated date of resolution  "get on the right medications" 08/05/2015      "depression" 08/05/2015     "go to long term treatment when I leave" 08/05/2015     "heroin use" 08/05/2015                                    DISCHARGE CRITERIA:  Ability to meet basic life and health needs Improved stabilization in mood, thinking, and/or behavior Safe-care adequate arrangements made Verbal commitment to aftercare and medication compliance Withdrawal symptoms are absent or subacute and managed without 24-hour nursing intervention  PRELIMINARY DISCHARGE PLAN: Attend aftercare/continuing care group Attend PHP/IOP  PATIENT/FAMIILY INVOLVEMENT: This treatment plan has been presented to and reviewed with the patient, Traci [name] .  The patient and family have been given the opportunity to ask questions and make suggestions.  Aurora Maskwyman, [place] Hall[place] E 08/05/2015, 3:19 PM

## 2015-08-05 NOTE — Progress Notes (Signed)
Treatment Plan/Recommendations: Will continue with current medications and recommendations as listed below. Patient continues to meet criteria for inpatient admission. Patient interviewed. Chart reviewed. Labs and vitals reviewed. Patient continues to present with depressive symptoms and symptoms related to drug withdraw. SHe is wanting to get help for her substance abuse. Pt has a history of multiple suicide attempts and [facility] same.  Will d/c from Observation and admit to unit 300- 306-2 For Substance Induced Mood D/O and Major Depressive disorder severe, hx of SA.  Pt is aware of admission. Average length of stay 3-5 days.  Review of chart, vital signs, medications, and notes.  1-Medication management for depression and anxiety: Medications reviewed with the patient and she stated no untoward effects, unchanged. 2-Coping skills for depression, anxiety  3-Continue crisis stabilization and management  4-Address health issues--monitoring vital signs, stable  5-Treatment plan in progress to prevent relapse of depression and substance use.    Medication management: Substance abuse; detox with clonidine protocol for opiate abuse, work a relapse prevention plan Anxiety; Neurontin 200 mg TID/Vistaril 50 mg  Insomnia; Ambien 5 mg HS Depression: Celexa 10 mg daily for depression Work with CBT/mindfulness

## 2015-08-05 NOTE — Progress Notes (Signed)
Patient attended N/A group tonight.  

## 2015-08-05 NOTE — Progress Notes (Signed)
  Report received from admitting RN, Otto Herbreka. Pt currently presents with a flat affect and anxious behavior. Pt supported emotionally and encouraged to express concerns and questions. Pt's safety ensured with 15 minute and environmental checks. Pt currently denies SI/HI and A/V hallucinations. Pt verbally agrees to seek staff if SI/HI or A/VH occurs and to consult with staff before acting on any harmful thoughts. Will continue POC.

## 2015-08-05 NOTE — Progress Notes (Signed)
Patient ID: Traci [name], female   DOB: 04/08/1985, 30 y.o.   MRN: [id number] Patient [facility] to [facility]. [facility].Patient denies SI on transfer. Stable.

## 2015-08-05 NOTE — Progress Notes (Signed)
Patient ID: Traci [name], female   DOB: 06/22/1985, 30 y.o.   MRN: [id number]  30 year old female transferred from Obs to the adult unit. Pt has a flat affect and cooperative but irritable mood. Pt reports that she came to the hospital after relapsing on heroin. Pt reports "I want to get on the right medications so I won't self medicate with heroin anymore." Pt also reports that she wants to go to long term treatment after discharge. Pt currently denies pain and other physical symptoms of withdrawal, states "it hasn't happened yet." Consents signed, belongings search completed and pt oriented to unit. Pt stable at this time. Pt given the opportunity to express concerns and ask questions. Pt given toiletries. Will continue to monitor.

## 2015-08-06 ENCOUNTER — Encounter (HOSPITAL_COMMUNITY): Payer: Self-pay | Admitting: Psychiatry

## 2015-08-06 NOTE — [facility] Suicide Risk Assessment (Signed)
[facility] INPATIENT:  Family/Significant Other Suicide Prevention Education  Suicide Prevention Education:  Patient Refusal for Family/Significant Other Suicide Prevention Education: The patient Traci [name] has refused to provide written consent for family/significant other to be provided Family/Significant Other Suicide Prevention Education during admission and/or prior to discharge.  Physician notified. SPE reviewed with patient and brochure provided. Patient encouraged to return to hospital if having suicidal thoughts, patient verbalized his/her understanding and has no further questions at this time.   Traci [name], [place]ristin [name] 08/06/2015, 3:09 PM

## 2015-08-06 NOTE — [facility] Group Notes (Signed)
[facility] LCSW Group Therapy 08/06/2015 1:15 PM Type of Therapy: Group Therapy Participation Level: Active  Participation Quality: Attentive, Sharing and Supportive  Affect: Depressed and Flat  Cognitive: Alert and Oriented  Insight: Developing/Improving and Engaged  Engagement in Therapy: Developing/Improving and Engaged  Modes of Intervention: Activity, Clarification, Confrontation, Discussion, Education, Exploration, Limit-setting, Orientation, Problem-solving, Rapport Building, Reality Testing, Socialization and Support  Summary of Progress/Problems: Patient was attentive and engaged with speaker from [employer]. Patient was attentive to speaker while they shared their story of dealing with mental health and overcoming it. Patient expressed interest in their programs and services and received information on their agency. Patient processed ways they can relate to the speaker.   Cheyenne Bordeaux, LCSW Clinical Social Worker [facility] [phone]    

## 2015-08-06 NOTE — Tx Team (Signed)
Interdisciplinary Treatment Plan Update (Adult) Date: 08/06/2015    Time Reviewed: 9:30 AM  Progress in Treatment: Attending groups: Continuing to assess, patient new to milieu Participating in groups: Continuing to assess, patient new to milieu Taking medication as prescribed: Yes Tolerating medication: Yes Family/Significant other contact made: No, CSW assessing for appropriate contacts Patient understands diagnosis: Yes Discussing patient identified problems/goals with staff: Yes Medical problems stabilized or resolved: Yes Denies suicidal/homicidal ideation: Yes Issues/concerns per patient self-inventory: Yes Other:  New problem(s) identified: N/A  Discharge Plan or Barriers: CSW continuing to assess, patient new to milieu.  Reason for Continuation of Hospitalization:  Depression Anxiety Medication Stabilization   Comments: N/A  Estimated length of stay: 3-5 days    Patient is a 30 year old female who presented to the hospital with SI with plan to overdose and heroin abuse. Pt reports primary trigger(s) for admission was recent relapse and being evicted from [facility]. Patient will benefit from crisis stabilization, medication evaluation, group therapy and psycho education in addition to case management for discharge planning. At discharge, it is recommended that Pt remain compliant with established discharge plan and continued treatment.   Review of initial/current patient goals per problem list:  1. Goal(s): Patient will participate in aftercare plan   Met: No   Target date: 3-5 days post admission date   As evidenced by: Patient will participate within aftercare plan AEB aftercare provider and housing plan at discharge being identified.  6/15: Goal not met: CSW assessing for appropriate referrals for pt and will have follow up secured prior to d/c.    2. Goal (s): Patient will exhibit decreased depressive symptoms and suicidal ideations.   Met:  No   Target date: 3-5 days post admission date   As evidenced by: Patient will utilize self rating of depression at 3 or below and demonstrate decreased signs of depression or be deemed stable for discharge by MD.  6/15: Goal not met: Pt presents with flat affect and depressed mood.  Pt admitted with depression rating of 10.  Pt to show decreased sign of depression and a rating of 3 or less before d/c.        4. Goal(s): Patient will demonstrate decreased signs of withdrawal due to substance abuse   Met: Yes   Target date: 3-5 days post admission date   As evidenced by: Patient will produce a CIWA/COWS score of 0, have stable vitals signs, and no symptoms of withdrawal  6/15: Goal met. No withdrawal symptoms reported at this time per medical chart.      Attendees:  Patient:    Family:    Physician: Dr. Parke Poisson, Dr. Kathie Dike MD  08/06/2015   Nursing: Gaylan Gerold, Chalmers Cater, RN 08/06/2015   Clinical Social Worker Peri Maris, [place] 08/06/2015   Other: Tilden Fossa, LCSW; Sentinel Smart LCSW 08/06/2015   Clinical: Samuel Jester, NP 08/06/2015              Scribe for Treatment Team:  Tilden Fossa, [place]

## 2015-08-06 NOTE — Progress Notes (Signed)
Patient ID: Traci DieterHannah [name], female   DOB: 07/31/1985, 30 y.o.   MRN: [id number]  DAR: Pt. Denies SI/HI and A/V Hallucinations. She reports sleep is fair, appetite is fair, energy level is between low-normal, and concentration is good. She rates depression 7/10, hopelessness 6/10, and anxiety 7/10. Patient reports pain and is receiving PRN medications for this. She denies withdrawals other than some agitation and irritability. She reports anxiety and received PRN anxiety. Support and encouragement provided to the patient. Scheduled medications administered to patient per physician's orders. Patient is minimal but cooperative. She is seen in the milieu interacting with peers and did attend afternoon group. Q15 minute checks are maintained for safety.

## 2015-08-06 NOTE — [facility] Suicide Risk Assessment (Signed)
[facility]   Nursing information obtained from:    Demographic factors:   30 year old female, currently homeless  Current Mental Status:   as below Loss Factors:   recent relapse, recently kicked out of [address] [facility], recently lost job  Historical Factors:   history of substance abuse  Risk Reduction Factors:   sense of responsibility to family   Total Time spent with patient: 45 minutes Principal Problem: Substance induced mood disorder (HCC) Diagnosis:   Patient Active Problem List   Diagnosis Date Noted  . 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 Moderate episode of recurrent major depressive disorder (HCC) [F33.1]   . Substance induced mood disorder (HCC) [F19.94] 05/30/2015  . Major depression, recurrent (HCC) [F33.9] 05/30/2015  . Polysubstance abuse [F19.10] 05/30/2015     Continued Clinical Symptoms:  Alcohol Use Disorder Identification Test Final Score (AUDIT): 1 The "Alcohol Use Disorders Identification Test", Guidelines for Use in Primary Care, Second Edition.  World Science writerHealth Organization [facility](WHO). Score between 0-7:  no or low risk or alcohol related problems. Score between 8-15:  moderate risk of alcohol related problems. Score between 16-19:  high risk of alcohol related problems. Score 20 or above:  warrants further diagnostic evaluation for alcohol dependence and treatment.   CLINICAL FACTORS:  30 year old single female, has a 30 year old daughter , currently living with the father. She had been living in an [address] [facility], but was recently kicked out due to relapse . Recently lost her job as well . Reports history of Opiate Dependence . Also reports history of depression, which she states is independent from substance abuse, as she feels depressed even when sober . History of suicidal attempt at age 30 by overdosing.  States she had been sober for a period of a few weeks  but had relapsed on heroin ( IV ) , about one month ago. States she  has been feeling depressed, anxious, and had developed some suicidal ideations, with thoughts of overdosing . States  " I knew I needed help", so presented to ED requesting help.  Denies medical issues  Parents alive, separated,  She has two siblings, patient states she is not close to family. Mother is alcoholic, sister, mother have  history of depression, At this time does not endorse severe symptoms of opiate WDL and does not appear to be in any acute distress  Dx- Opiate Dependece,  Plan - Opiate detox protocol with clonidine- patient reports history of good response to Neurontin and no side effects, continue at 200 mgrs BID , continue Celexa trial at 10 mgrs QDAY  At patient's request will check Hep C, Hep B, HIV HCG reported at 6.5, patient denies amenorrhea and does not think she is pregnant- will repeat tonight.    Musculoskeletal: Strength & Muscle Tone: within normal limits Gait & Station: normal Patient leans: N/A  Psychiatric Specialty Exam: Physical Exam  ROS denies headache, no vomiting, no diarrhea at this time  Blood pressure 103/72, pulse 77, temperature 97.9 F (36.6 C), temperature source Oral, resp. rate 18, height 5\' 4"  (1.626 m), weight 152 lb (68.947 kg), last menstrual period 07/23/2015, SpO2 100 %.Body mass index is 26.08 kg/(m^2).  General Appearance: Well Groomed  Eye Contact:  Good  Speech:  Normal Rate  Volume:  Normal  Mood:  milldy depressed,  Affect:  Appropriate, smiles at times appropriately   Thought Process:  Linear  Orientation:  Full (Time, Place, and Person)  Thought  Content:  denies hallucinations, no delusions   Suicidal Thoughts:  No- denies suicidal ideations , denies self injurious ideations   Homicidal Thoughts:  No- denies any homicidal ideations   Memory:  Recent and remote grossly intact   Judgement:  Fair  Insight:  Present   Psychomotor Activity:  Normal- does not appear to be in any acute distress   Concentration:  Concentration:  Good and Attention Span: Good  Recall:  Good  Fund of Knowledge:  Good  Language:  Good  Akathisia:  Negative  Handed:  Right  AIMS (if indicated):     Assets:  Desire for Improvement Resilience  ADL's:  Intact  Cognition:  WNL  Sleep:          COGNITIVE FEATURES THAT CONTRIBUTE TO RISK:  Closed-mindedness and Loss of executive function    SUICIDE RISK:   Mild:  Suicidal ideation of limited frequency, intensity, duration, and specificity.  There are no identifiable plans, no associated intent, mild dysphoria and related symptoms, good self-control (both objective and subjective assessment), few other risk factors, and identifiable protective factors, including available and accessible social support. Marland Kitchen  PLAN OF CARE: Patient will be admitted to inpatient psychiatric unit for stabilization and safety. Will provide and encourage milieu participation. Provide medication management and maked adjustments as needed.   Will also provide medication management to minimize risk of WDL. Will follow daily  I certify that inpatient services furnished can reasonably be expected to improve the patient's condition.   Nehemiah Massed, MD 08/06/2015, 4:40 PM
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
## 2015-08-06 NOTE — H&P (Signed)
Psychiatric Admission Assessment Adult  Patient Identification: Traci DieterHannah Bartosiewicz MRN:  [id number] Date of Evaluation:  08/06/2015 Chief Complaint:  SUBSTANCE INDUCED MOOD DISORDER Principal Diagnosis: Substance induced mood disorder (HCC) Diagnosis:   Patient Active Problem List   Diagnosis Date Noted  . 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 Moderate episode of recurrent major depressive disorder (HCC) [F33.1]   . Substance induced mood disorder (HCC) [F19.94] 05/30/2015  . Major depression, recurrent (HCC) [F33.9] 05/30/2015  . Polysubstance abuse [F19.10] 05/30/2015   History of Present Illness:  Traci [name], 30 yo came in after heroin use for about a month now.  Patient has a history of depression even when sober.  History of suicidal attempt at age 30 by overdosing. Reports history of Opiate Dependence.  She had been living in an [address], but was recently kicked out due to relapse.  She states that she recently lost her job.   History of suicidal attempt at age 30 by overdosing.  States she has been recently been feeling depressed, anxious, and had developed some suicidal ideations, with thoughts of overdosing . States " I knew I needed help", so presented to ED requesting help.   Associated Signs/Symptoms: Depression Symptoms:  depressed mood, insomnia, (Hypo) Manic Symptoms:  Impulsivity, Labiality of Mood, Anxiety Symptoms:  Excessive Worry, Psychotic Symptoms:  NA PTSD Symptoms: NA Total Time spent with patient: 30 minutes  Past Psychiatric History: see HPI  Is the patient at risk to self? Yes.    Has the patient been a risk to self in the past 6 months? Yes.    Has the patient been a risk to self within the distant past? Yes.    Is the patient a risk to others? No.  Has the patient been a risk to others in the past 6 months? No.  Has the patient been a risk to others within the distant past? No.   Prior Inpatient Therapy:   Prior Outpatient Therapy:     Alcohol Screening: 1. How often do you have a drink containing alcohol?: Monthly or less 2. How many drinks containing alcohol do you have on a typical day when you are drinking?: 1 or 2 3. How often do you have six or more drinks on one occasion?: Never Preliminary Score: 0 9. Have you or someone else been injured as a result of your drinking?: No 10. Has a relative or friend or a doctor or another health worker been concerned about your drinking or suggested you cut down?: No Alcohol Use Disorder Identification Test Final Score (AUDIT): 1 Substance Abuse History in the last 12 months:  Yes.   Consequences of Substance Abuse: inpatient crisis management Previous Psychotropic Medications: Yes  Psychological Evaluations: Yes  Past Medical History:  Past Medical History  Diagnosis Date  . ADD (attention deficit disorder)   . Depression   . Substance abuse    History reviewed. No pertinent past surgical history. Family History: History reviewed. No pertinent family history. Family Psychiatric  History:   Mother is alcoholic, sister, mother have history of depression, Tobacco Screening: @FLOW ([id number])::1)@ Social History:  History  Alcohol Use No     History  Drug Use  . Yes  . Special: Heroin    Additional Social History:  Traci [name] is single mother to a 30 year old daughter. Parents alive, separated, She has two siblings, patient states she is not close to family.  Allergies:   Allergies  Allergen Reactions  . Zinc Oxide Itching and Swelling  Lab Results: No results found for this or any previous visit (from the past 48 hour(s)).  Blood Alcohol level:  Lab Results  Component Value Date   ETH <5 08/03/2015   ETH <5 05/29/2015    Metabolic Disorder Labs:  No results found for: HGBA1C, MPG No results found for: PROLACTIN No results found for: CHOL, TRIG, HDL, CHOLHDL, VLDL, LDLCALC  Current Medications: Current Facility-Administered Medications  Medication Dose  Route Frequency Provider Last Rate Last Dose  . acetaminophen (TYLENOL) tablet 650 mg  650 mg Oral Q6H PRN Charm Rings, NP   650 mg at 08/05/15 0654  . alum & mag hydroxide-simeth (MAALOX/MYLANTA) 200-200-20 MG/5ML suspension 30 mL  30 mL Oral Q4H PRN Charm Rings, NP      . citalopram (CELEXA) tablet 10 mg  10 mg Oral Daily Charm Rings, NP   10 mg at 08/05/15 1610  . cloNIDine (CATAPRES) tablet 0.1 mg  0.1 mg Oral BH-qamhs Charm Rings, NP       Followed by  . [START ON 08/09/2015] cloNIDine (CATAPRES) tablet 0.1 mg  0.1 mg Oral QAC breakfast Charm Rings, NP      . dicyclomine (BENTYL) tablet 20 mg  20 mg Oral Q6H PRN Charm Rings, NP      . gabapentin (NEURONTIN) capsule 200 mg  200 mg Oral TID Charm Rings, NP   200 mg at 08/05/15 1715  . guaiFENesin (ROBITUSSIN) 100 MG/5ML solution 100 mg  5 mL Oral Q6H PRN Thermon Leyland, NP      . hydrOXYzine (ATARAX/VISTARIL) tablet 25 mg  25 mg Oral Q6H PRN Charm Rings, NP   25 mg at 08/05/15 1715  . loperamide (IMODIUM) capsule 2-4 mg  2-4 mg Oral PRN Charm Rings, NP      . magnesium hydroxide (MILK OF MAGNESIA) suspension 30 mL  30 mL Oral Daily PRN Charm Rings, NP      . menthol-cetylpyridinium (CEPACOL) lozenge 3 mg  1 lozenge Oral PRN Thermon Leyland, NP      . methocarbamol (ROBAXIN) tablet 500 mg  500 mg Oral Q8H PRN Charm Rings, NP   500 mg at 08/05/15 1714  . naproxen (NAPROSYN) tablet 500 mg  500 mg Oral BID PRN Charm Rings, NP   500 mg at 08/05/15 1226  . nicotine (NICODERM CQ - dosed in mg/24 hours) patch 21 mg  21 mg Transdermal Daily Charm Rings, NP   21 mg at 08/05/15 9604  . ondansetron (ZOFRAN-ODT) disintegrating tablet 4 mg  4 mg Oral Q6H PRN Charm Rings, NP      . zolpidem (AMBIEN) tablet 5 mg  5 mg Oral QHS PRN Charm Rings, NP   5 mg at 08/05/15 2310   PTA Medications: Prescriptions prior to admission  Medication Sig Dispense Refill Last Dose  . acetaminophen (TYLENOL) 325 MG tablet Take 2  tablets (650 mg total) by mouth every 6 (six) hours as needed for moderate pain.   unknown  . gabapentin (NEURONTIN) 100 MG capsule Take 1 capsule (100 mg total) by mouth 3 (three) times daily. For agitation (Patient not taking: Reported on 08/03/2015) 90 capsule 0 Not Taking at Unknown time  . hydrOXYzine (ATARAX/VISTARIL) 50 MG tablet Take 1 tablet (50 mg total) by mouth every 6 (six) hours as needed for anxiety. (Patient not taking: Reported on 08/03/2015) 60 tablet 0 Not Taking at Unknown time  . nicotine (NICODERM  CQ - DOSED IN MG/24 HOURS) 21 mg/24hr patch Place 1 patch (21 mg total) onto the skin daily. For nicotine addiction (Patient not taking: Reported on 08/03/2015) 28 patch 0 Not Taking at Unknown time  . traZODone (DESYREL) 100 MG tablet Take 1 tablet (100 mg total) by mouth at bedtime as needed and may repeat dose one time if needed for sleep. (Patient not taking: Reported on 08/03/2015) 60 tablet 0 Not Taking at Unknown time    Musculoskeletal: Strength & Muscle Tone: within normal limits Gait & Station: normal Patient leans: N/A  Psychiatric Specialty Exam: Physical Exam  Vitals reviewed. Psychiatric: Her mood appears anxious. She is not aggressive. Thought content is not paranoid. She exhibits a depressed mood. She expresses no homicidal and no suicidal ideation.    ROS  Blood pressure 106/77, pulse 63, temperature 97.9 F (36.6 C), temperature source Oral, resp. rate 18, height 5\' 4"  (1.626 m), weight 68.947 kg (152 lb), last menstrual period 07/23/2015, SpO2 100 %.Body mass index is 26.08 kg/(m^2).  General Appearance: Neat  Eye Contact:  Good  Speech:  Clear and Coherent  Volume:  Normal  Mood:  Anxious  Affect:  Appropriate  Thought Process:  Goal Directed  Orientation:  Full (Time, Place, and Person)  Thought Content:  Logical and Rumination  Suicidal Thoughts:  No  Homicidal Thoughts:  No  Memory:  Immediate;   Fair Recent;   Fair Remote;   Fair  Judgement:  Fair   Insight:  Lacking  Psychomotor Activity:  Normal  Concentration:  Concentration: Fair and Attention Span: Fair  Recall:  [employer] of Knowledge:  Good  Language:  Good  Akathisia:  Negative  Handed:  Right  AIMS (if indicated):     Assets:  Communication Skills Physical Health  ADL's:  Intact  Cognition:  WNL  Sleep:  poor   Treatment Plan Summary: Admit for crisis management and mood stabilization. Medication management to re-stabilize current mood symptoms Group counseling sessions for coping skills Medical consults as needed Review and reinstate any pertinent home medications for other health problems   Observation Level/Precautions:  15 minute checks  Laboratory:  HIV, Heb B  and Hep C serology  Psychotherapy:  group  Medications:  Celexa 10 mg depression, Clonidine detox, Gabapentin 200 mg anxiety  Consultations:  As needed  Discharge Concerns:  safety  Estimated LOS:  5-7 days  Other:     I certify that inpatient services furnished can reasonably be expected to improve the patient's condition.    [facility], NP [facility] 6/15/20174:59 PM I have reviewed case with NP and have seen patient- agree with NP note and assessment 30 year old single female, has a 41 year old daughter , currently living with the father. She had been living in an [address], but was recently kicked out due to relapse . Recently lost her job as well . Reports history of Opiate Dependence . Also reports history of depression, which she states is independent from substance abuse, as she feels depressed even when sober . History of suicidal attempt at age 57 by overdosing.  States she had been sober for a period of a few weeks but had relapsed on heroin ( IV ) , about one month ago. States she has been feeling depressed, anxious, and had developed some suicidal ideations, with thoughts of overdosing . States " I knew I needed help", so presented to ED requesting help.  Denies medical issues   Parents  alive, separated, She has two siblings, patient states she is not close to family. Mother is alcoholic, sister, mother have history of depression, At this time does not endorse severe symptoms of opiate WDL and does not appear to be in any acute distress  Dx- Opiate Dependece,  Plan - Opiate detox protocol with clonidine- patient reports history of good response to Neurontin and no side effects, continue at 200 mgrs BID , continue Celexa trial at 10 mgrs QDAY  At patient's request will check Hep C, Hep B, HIV HCG reported at 6.5, patient denies amenorrhea and does not think she is pregnant- will repeat tonight.

## 2015-08-06 NOTE — Progress Notes (Signed)
Adult Psychoeducational Group Note  Date:  08/06/2015 Time:  10:30 PM  Group Topic/Focus:  Wrap-Up Group:   The focus of this group is to help patients review their daily goal of treatment and discuss progress on daily workbooks.  Participation Level:  Active  Participation Quality:  Appropriate  Affect:  Appropriate  Cognitive:  Alert  Insight: Appropriate  Engagement in Group:  Engaged  Modes of Intervention:  Discussion  Additional Comments:  Patient states, "I had a pretty good day". Patient states her withdrawals are over. Patient goal for today was to talk with Social Work[facility].  Vara Mairena L [facility] 08/06/2015, 10:30 PM

## 2015-08-07 LAB — HEPATITIS C ANTIBODY

## 2015-08-07 LAB — HIV ANTIBODY (ROUTINE TESTING W REFLEX): HIV SCREEN 4TH GENERATION: NONREACTIVE

## 2015-08-07 LAB — BETA HCG QUANT (REF LAB)

## 2015-08-07 LAB — HEPATITIS B SURFACE ANTIGEN: HEP B S AG: NEGATIVE

## 2015-08-07 NOTE — Progress Notes (Deleted)
Traci [name] is OOb [facility] on the [address] today...toelrated well. She has received 2 prn doses of vistaril ( for c/o anxiety) and states this has helped her both times. A She says she is thinking about [facility] or [facility]. ( as dc ) R Safety in place and poc cont.

## 2015-08-07 NOTE — Progress Notes (Addendum)
[facility] MD Progress Note  08/07/2015 5:01 PM Traci [name]  MRN:  [id number] Subjective:  Patient reports she is feeling better. Mood is improving and she is feeling less depressed . She denies any major /severe withdrawal symptoms, reports some residual cramps , aches, nausea, but no vomiting . At this time denies medication side effects. Objective : I have discussed case with treatment team and have met with patient . Patient reports improving mood, less depression, and states she feels motivated to go to a Rehab on discharge . At this time not on any acute distress, reports, as above, some residual WDL symptoms but presents calm, comfortable . No disruptive or agitated behaviors on the unit . Denies medication  Side effects As discussed with CSW and patient , she is tentatively scheduled to go to [facility] on Monday, in which case she wants to discharge Sunday in order to get her belongings and clothes she needs. Labs- BHCG <1. HIV, Hep C, Hep B screens negative   Principal Problem: Substance induced mood disorder ([place]) Diagnosis:   Patient Active Problem List   Diagnosis Date Noted  . 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 Moderate episode of recurrent major depressive disorder ([place]) [F33.1]   . Substance induced mood disorder ([place]) [F19.94] 05/30/2015  . Major depression, recurrent ([place]) [F33.9] 05/30/2015  . Polysubstance abuse [F19.10] 05/30/2015   Total Time spent with patient: 20 minutes    Past Medical History:  Past Medical History  Diagnosis Date  . ADD (attention deficit disorder)   . Depression   . Substance abuse    History reviewed. No pertinent past surgical history. Family History: History reviewed. No pertinent family history.  Social History:  History  Alcohol Use No     History  Drug Use  . Yes  . Special: Heroin    Social History   Social History  . Marital Status: Single    Spouse Name: N/A  . Number of Children: N/A  . Years of  Education: N/A   Social History Main Topics  . Smoking status: Current Every Day Smoker -- 1.00 packs/day for 10 years    Types: Cigarettes  . Smokeless tobacco: None  . Alcohol Use: No  . Drug Use: Yes    Special: Heroin  . Sexual Activity: Not Currently    Birth Control/ Protection: IUD   Other Topics Concern  . None   Social History Narrative   Additional Social History:   Sleep: improving   Appetite:  Fair  Current Medications: Current Facility-Administered Medications  Medication Dose Route Frequency Provider Last Rate Last Dose  . acetaminophen (TYLENOL) tablet 650 mg  650 mg Oral Q6H PRN Patrecia Pour, NP   650 mg at 08/05/15 0654  . alum & mag hydroxide-simeth (MAALOX/MYLANTA) 200-200-20 MG/5ML suspension 30 mL  30 mL Oral Q4H PRN Patrecia Pour, NP      . citalopram (CELEXA) tablet 10 mg  10 mg Oral Daily Patrecia Pour, NP   10 mg at 08/07/15 0811  . cloNIDine (CATAPRES) tablet 0.1 mg  0.1 mg Oral BH-qamhs Patrecia Pour, NP   0.1 mg at 08/06/15 2200   Followed by  . [START ON 08/09/2015] cloNIDine (CATAPRES) tablet 0.1 mg  0.1 mg Oral QAC breakfast Patrecia Pour, NP      . dicyclomine (BENTYL) tablet 20 mg  20 mg Oral Q6H PRN Patrecia Pour, NP      . gabapentin (NEURONTIN) capsule 200 mg  200 mg Oral TID Patrecia Pour, NP   200 mg at 08/07/15 1204  . guaiFENesin (ROBITUSSIN) 100 MG/5ML solution 100 mg  5 mL Oral Q6H PRN Niel Hummer, NP      . hydrOXYzine (ATARAX/VISTARIL) tablet 25 mg  25 mg Oral Q6H PRN Patrecia Pour, NP   25 mg at 08/07/15 1543  . loperamide (IMODIUM) capsule 2-4 mg  2-4 mg Oral PRN Patrecia Pour, NP      . magnesium hydroxide (MILK OF MAGNESIA) suspension 30 mL  30 mL Oral Daily PRN Patrecia Pour, NP      . menthol-cetylpyridinium (CEPACOL) lozenge 3 mg  1 lozenge Oral PRN Niel Hummer, NP      . methocarbamol (ROBAXIN) tablet 500 mg  500 mg Oral Q8H PRN Patrecia Pour, NP   500 mg at 08/07/15 1543  . naproxen (NAPROSYN) tablet 500 mg   500 mg Oral BID PRN Patrecia Pour, NP   500 mg at 08/06/15 1826  . nicotine (NICODERM CQ - dosed in mg/24 hours) patch 21 mg  21 mg Transdermal Daily Patrecia Pour, NP   21 mg at 08/07/15 0800  . ondansetron (ZOFRAN-ODT) disintegrating tablet 4 mg  4 mg Oral Q6H PRN Patrecia Pour, NP      . zolpidem (AMBIEN) tablet 5 mg  5 mg Oral QHS PRN Patrecia Pour, NP   5 mg at 08/06/15 2116    Lab Results:  Results for orders placed or performed during the hospital encounter of 08/04/15 (from the past 48 hour(s))  Beta HCG, Quant     Status: None   Collection Time: 08/06/15  6:13 PM  Result Value Ref Range   Beta hCG, Tumor Marker <1 mIU/mL    Comment: (NOTE)                     Female (Non-pregnant)    0 -     5                            (Postmenopausal)  0 -     8                     Female (Pregnant)                     Weeks of Gestation                             3                6 -    71                             4               10 -   750                             5              217 -  7138                             6  [place] metho dology Performed At: [facility] [place], [place] [id number] Lindon Romp MD CB:[id number] Performed at [facility]   Hepatitis C antibody     Status: None   Collection Time: 08/06/15  6:13 PM  Result Value Ref Range   HCV Ab  <0.1 0.0 - 0.9 s/co ratio    Comment: (NOTE)                                  Negative:     < 0.8                             Indeterminate: 0.8 - 0.9                                  Positive:     >  0.9 The CDC recommends that a positive HCV antibody result be followed up with a HCV Nucleic Acid Amplification test (154008). Performed At: [facility] [place], [place] [id number] Lindon Romp MD OI:[id number] Performed at [facility]   Hepatitis B surface antigen     Status: None   Collection Time: 08/06/15  6:13 PM  Result Value Ref Range   Hepatitis B Surface Ag Negative Negative    Comment: (NOTE) Performed At: [facility] [address] [place], [place] [id number] Lindon Romp MD [id number] Performed at [facility]   HIV antibody     Status: None   Collection Time: 08/06/15  6:13 PM  Result Value Ref Range   HIV Screen 4th Generation wRfx Non Reactive Non Reactive    Comment: (NOTE) Performed At: [facility] [place], [place] [id number] Lindon Romp MD [id number] Performed at [facility]     Blood Alcohol level:  Lab Results  Component Value Date   [facility] <5 08/03/2015   ETH <5 05/29/2015    Physical Findings: AIMS:  , ,  ,  ,    CIWA:  CIWA-Ar Total: 5 COWS:  COWS Total Score: 0  Musculoskeletal: Strength & Muscle Tone: within normal limits Gait & Station: normal Patient leans: N/A  Psychiatric Specialty Exam: Physical Exam  ROSsome residual aches, pains ( diffuse but mostly on lower extremities) , denies vomiting, no diarrhea, no fever, no chills   Blood pressure 119/78, pulse 61, temperature 98.8 F (37.1 C), temperature source Oral, resp. rate 18, height _0  (1.626 m), weight 152 lb (68.947 kg), last menstrual period 07/23/2015, SpO2 100 %.Body mass index is 26.08 kg/(m^2).  General Appearance: improved grooming   Eye  Contact:  Good  Speech:  Normal Rate  Volume:  Normal  Mood:  improving mood   Affect: improving mood, more reactive, less constricted   Thought Process:  Linear  Orientation:  Full (Time, Place, and Person)  Thought Content:  denies hallucinations, no delusions   Suicidal Thoughts:  No- denies any suicidal ideations at this time, contracts for safety on unit   Homicidal Thoughts:  No- denies any  homicidal ideations, any violent ideations   Memory:  recent and remote grossly intact   Judgement:  Improved   Insight:  improved   Psychomotor Activity:  Normal  Concentration:  Concentration: Good and Attention Span: Good  Recall:  Good  Fund of Knowledge:  Good  Language:  Good  Akathisia:  Negative  Handed:  Right  AIMS (if indicated):     Assets:  Desire for Improvement Resilience  ADL's:  Intact  Cognition:  WNL  Sleep:  Number of Hours: 6  Assessment - - patient presents with partial improvement of mood , affect, and is currently future oriented, looking forward to going to a residential rehab setting [facility]- has bed for Monday). She reports some mild to moderate symptoms of opiate WDL but presents improved, in no acute distress, and vitals are stable. Currently tolerating medications well . Lab results reviewed with patient, as above    Treatment Plan Summary: Daily contact with patient to assess and evaluate symptoms and progress in treatment, Medication management, Plan inpatient treatment  and medications as below  Continue to encourage group, milieu participation to work on coping skills and symptom reduction  Continue Opiate Detox protocol to minimize risk of opiate WDL  Continue Celexa 10 mgrs QDAY for  depression and anxiety  Continue Neurontin 200 mgrs TID for anxiety, pain Continue Vistaril 25 mgrs Q 6 hours PRN for anxiety as needed  Treatment team working on disposition planning, at this time plan is for patient to go to [facility] on Monday .  Traci Garnet, MD 08/07/2015, 5:01 PM

## 2015-08-07 NOTE — [facility] Group Notes (Signed)
[facility]   08/07/2015 9:47 AM  Participation Quality:  Invited. Chose to rest in room. DID NOT ATTEND.   Smart, Makhiya Coburn LCSW

## 2015-08-07 NOTE — Progress Notes (Signed)
D Kanna is seen standing at the med window this morning. She is bright, anxious to get  Her meds and she says" I want to know the results of my lab tests...its HIV and UPT". Meds administered and pt advised practitioner would be giving those results per protocol. A Pt completed her daily assessment and on it she wrote she deneid SI today and she rated her depression, hopelessness and anxiety " 6/6/6". R Safety in place.

## 2015-08-07 NOTE — Progress Notes (Signed)
Patient ID: Traci DieterHannah [name], female   DOB: 09/03/1985, 30 y.o.   MRN: [id number] D: Patient observed watching TV and interacting well with peers. Pt reports tolerating medication well. Pt denies SI/HI/AVH and withdrawal symptoms. Pt [facility]. No behavioral issues noted.  A: Support and encouragement offered as needed. Medications administered as prescribed.  R: Patient safe and cooperative on unit. Pt attended evening AA group this evening.

## 2015-08-07 NOTE — Tx Team (Addendum)
Interdisciplinary Treatment Plan Update (Adult) Date: 08/07/2015    Time Reviewed: 9:30 AM  Progress in Treatment: Attending groups: Yes Participating in groups: Yes Taking medication as prescribed: Yes Tolerating medication: Yes Family/Significant other contact made: Pt declined to consent to family contacts. SPE completed with pt.  Patient understands diagnosis: Yes Discussing patient identified problems/goals with staff: Yes Medical problems stabilized or resolved: Yes Denies suicidal/homicidal ideation: Yes Issues/concerns per patient self-inventory: Yes Other:  New problem(s) identified: N/A  Discharge Plan or Barriers: Pt accepted for [facility] screening for admission Monday at 8:00AM. Pt aware that she must bring photo ID/[place] residency. [facility] for o/p services.   Reason for Continuation of Hospitalization:  Medication Stabilization   Comments: N/A  Estimated length of stay: 1 day-pt tentatively scheduled for discharge on Saturday at 11:00AM (Per Dr. Parke Poisson)    Patient is a 30 year old female who presented to the hospital with SI with plan to overdose and heroin abuse. Pt reports primary trigger(s) for admission was recent relapse and being evicted from [facility]. Patient will benefit from crisis stabilization, medication evaluation, group therapy and psycho education in addition to case management for discharge planning. At discharge, it is recommended that Pt remain compliant with established discharge plan and continued treatment.   Review of initial/current patient goals per problem list:  1. Goal(s): Patient will participate in aftercare plan   Met: Yes  Target date: 3-5 days post admission date   As evidenced by: Patient will participate within aftercare plan AEB aftercare provider and housing plan at discharge being identified.  6/15: Goal not met: CSW assessing for appropriate referrals for pt and will have follow up secured prior to  d/c.  6/16: Goal met. Pt has screening for admission to [facility] on Monday 6/19    2. Goal (s): Patient will exhibit decreased depressive symptoms and suicidal ideations.   Met: yes   Target date: 3-5 days post admission date   As evidenced by: Patient will utilize self rating of depression at 3 or below and demonstrate decreased signs of depression or be deemed stable for discharge by MD.  6/15: Goal not met: Pt presents with flat affect and depressed mood.  Pt admitted with depression rating of 10.  Pt to show decreased sign of depression and a rating of 3 or less before d/c.    6/16: Goal met. Pt presents with pleasant mood/calm affect. She reports decreasing depression and feels comfortable with discharge on Sunday. Pt appears to be nearing her baseline.     3. Goal(s): Patient will demonstrate decreased signs of withdrawal due to substance abuse   Met: Yes   Target date: 3-5 days post admission date   As evidenced by: Patient will produce a CIWA/COWS score of 0, have stable vitals signs, and no symptoms of withdrawal  6/15: Goal met. No withdrawal symptoms reported at this time per medical chart.      Attendees:  Patient:    Family:    Physician: Dr. Parke Poisson, Dr.  08/07/2015 1:26 PM   Nursing: Nira Conn RN 08/07/2015 1:26 PM   Clinical Social Worker Peri Maris, [place] 08/07/2015 1:26 PM   Other: Tilden Fossa, LCSW; Keveon Amsler Smart LCSW 08/07/2015 1:26 PM   Clinical: Samuel Jester, NP 08/07/2015 1:26 PM              Scribe for Treatment Team:  Maxie Better, MSW, LCSW Clinical Social Worker 08/07/2015 1:26 PM

## 2015-08-07 NOTE — Progress Notes (Signed)
Recreation Therapy Notes  Date: 06.16.2017 Time: 9:30am Location: [address] Group Room   Group Topic: Stress Management  Goal Area(s) Addresses:  Patient will actively participate in stress management techniques presented during session.   Behavioral Response: Did not attend.   Lorisa Scheid L Truda Staub, LRT/CTRS        Hatcher Froning L 08/07/2015 10:27 AM 

## 2015-08-07 NOTE — Progress Notes (Addendum)
  [facility] :  Will you be returning to the same living situation after discharge:  No.Pt will stay with a friend until Monday [facility] at [facility] for admission.  At discharge, do you have transportation home?: Yes,  pt's friend will pick her up on Saturday at 11:00AM. Do you have the ability to pay for your medications: Yes,  mental health  Release of information consent forms completed and submitted to medical records by CSW.  Patient to Follow up at: Follow-up Information    [facility]  On 08/10/2015.   Why:  Assessment for possible admission on Monday June 19th at 8am. Bring [place] ID, 2 week supply, & clothing in case of admission.    Contact information:   [address] [place], [place] [address] Phone: [phone]      [facility].   Why:  Walk in between 8am-9am Monday through Friday for hospital follow-up/medication management/assessment for counseling services.    Contact information:   [address]  [place], [place] [address] Phone: [phone] Fax: [phone]      Next level of care provider has [facility] Link:no  Safety Planning and Suicide Prevention discussed: Yes,  SPE completed with pt; pt declined to consent to family contact. SPI pamphlet and mobile crisis information provided to pt.   Have you used any form of tobacco in the last 30 days? (Cigarettes, Smokeless Tobacco, Cigars, and/or Pipes): Yes  Has patient been referred to the Quitline?: Patient refused referral  Patient has been referred for addiction treatment: Yes  Smart, Kaytelyn Glore LCSW 08/07/2015, 1:22 PM

## 2015-08-07 NOTE — [facility] Group Notes (Signed)
[facility] LCSW Group Therapy  08/07/2015 1:31 PM  Type of Therapy:  Group Therapy  Participation Level:  Did Not Attend-pt in room. Declined to attend.   Modes of Intervention:  Confrontation, Discussion, Education, Exploration, Problem-solving, Rapport Building, Socialization and Support  Summary of Progress/Problems: Feelings around Relapse. Group members discussed the meaning of relapse and shared personal stories of relapse, how it affected them and others, and how they perceived themselves during this time. Group members were encouraged to identify triggers, warning signs and coping skills used when facing the possibility of relapse. Social supports were discussed and explored in detail. Post Acute Withdrawal Syndrome (handout provided) was introduced and examined. Pt's were encouraged to ask questions, talk about key points associated with PAWS, and process this information in terms of relapse prevention.   Smart, Earnie Rockhold LCSW 08/07/2015, 1:31 PM

## 2015-08-07 NOTE — Progress Notes (Signed)
Patient did attend the evening speaker AA meeting.  

## 2015-08-07 NOTE — Progress Notes (Signed)
Pt reports she is doing "ok" this evening.  She has been observed in the dayroom watching TV and interacting with her peers.  Pt denies SI/HI/AVH.  Pt denies any withdrawal symptoms at this time.  An EKG was completed this evening per order in chart.  Pt has been pleasant and cooperative with staff this evening.  Pt makes her needs known to staff.  She is med compliant on the unit.  Pt voices no needs or concerns at this time.  Support and encouragement offered.  Discharge plans are in process.  Pt wants to go to long term treatment after [facility] and is working with her CSW on where she will be going.  Safety maintained with q15 minute checks.

## 2015-08-08 DIAGNOSIS — F1123 Opioid dependence with withdrawal: Secondary | ICD-10-CM | POA: Insufficient documentation

## 2015-08-08 DIAGNOSIS — F1994 Other psychoactive substance use, unspecified with psychoactive substance-induced mood disorder: Secondary | ICD-10-CM

## 2015-08-08 DIAGNOSIS — F341 Dysthymic disorder: Secondary | ICD-10-CM

## 2015-08-08 MED ORDER — HYDROXYZINE HCL 25 MG PO TABS: 25.0000 mg | ORAL_TABLET | Freq: Four times a day (QID) | ORAL | Status: AC | PRN

## 2015-08-08 MED ORDER — GABAPENTIN 100 MG PO CAPS: 200.0000 mg | ORAL_CAPSULE | Freq: Three times a day (TID) | ORAL | Status: AC

## 2015-08-08 MED ORDER — CITALOPRAM HYDROBROMIDE 10 MG PO TABS: 10.0000 mg | ORAL_TABLET | Freq: Every day | ORAL | Status: DC

## 2015-08-08 MED ORDER — ZOLPIDEM TARTRATE 5 MG PO TABS: 5.0000 mg | ORAL_TABLET | Freq: Every evening | ORAL | Status: AC | PRN

## 2015-08-08 NOTE — Progress Notes (Signed)
Patient ID: Traci DieterHannah [name], female   DOB: 08/18/1985, [age] y.o.   MRN: [id number] Patient discharged per physician order; patient denies SI/HI and A/V hallucinations; patient received samples, prescriptions, AVS, transition record, and suicide risk note after it was reviewed with her patient verbalized and signed that she received all belongings; patient had no other questions or concerns at this time; patient left the unit ambulatory

## 2015-08-08 NOTE — Discharge Summary (Signed)
Physician Discharge Summary Note  Patient:  Traci [name] is an 30 y.o., female MRN:  [id number] DOB:  [birth year] Patient phone:  [phone] (home)  Patient address:   [address]  [place] [address],  Total Time spent with patient: 30 minutes  Date of Admission:  08/04/2015 Date of Discharge: 08/08/2015  Reason for Admission:PER HPI- Traci [name], 38 yo came in after heroin use for about a month now. Patient has a history of depression even when sober. History of suicidal attempt at age 83 by overdosing. Reports history of Opiate Dependence. She had been living in an [address], but was recently kicked out due to relapse. She states that she recently lost her job. History of suicidal attempt at age 52 by overdosing. States she has been recently been feeling depressed, anxious, and had developed some suicidal ideations, with thoughts of overdosing . States " I knew I needed help", so presented to ED requesting help.   Principal Problem: Substance induced mood disorder [facility]) Discharge Diagnoses: Patient Active Problem List   Diagnosis Date Noted  . 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 Moderate episode of recurrent major depressive disorder (HCC) [F33.1]   . Substance induced mood disorder (HCC) [F19.94] 05/30/2015  . Major depression, recurrent (HCC) [F33.9] 05/30/2015  . Polysubstance abuse [F19.10] 05/30/2015    Past Psychiatric History: See Above  Past Medical History:  Past Medical History  Diagnosis Date  . ADD (attention deficit disorder)   . Depression   . Substance abuse    History reviewed. No pertinent past surgical history. Family History: History reviewed. No pertinent family history. Family Psychiatric  History: See Above Social History:  History  Alcohol Use No     History  Drug Use  . Yes  . Special: Heroin    Social History   Social History  . Marital Status: Single    Spouse Name: N/A  . Number of Children: N/A  .  Years of Education: N/A   Social History Main Topics  . Smoking status: Current Every Day Smoker -- 1.00 packs/day for 10 years    Types: Cigarettes  . Smokeless tobacco: None  . Alcohol Use: No  . Drug Use: Yes    Special: Heroin  . Sexual Activity: Not Currently    Birth Control/ Protection: IUD   Other Topics Concern  . None   Social History Narrative    Hospital Course: Ryin Schillo was admitted for Substance induced mood disorder (HCC)  and crisis management.  Pt was treated discharged with the medications listed below under Medication List.  Medical problems were identified and treated as needed.  Home medications were restarted as appropriate.  Improvement was monitored by observation and Clotilde Dieter 's daily report of symptom reduction.  Emotional and mental status was monitored by daily self-inventory reports completed by Clotilde Dieter and clinical staff.         Linsey Arteaga was evaluated by the treatment team for stability and plans for continued recovery upon discharge. Jaidence Geisler 's motivation was an integral factor for scheduling further treatment. Employment, transportation, bed availability, health status, family support, and any pending legal issues were also considered during hospital stay. Pt was offered further treatment options upon discharge including but not limited to Residential, Intensive Outpatient, and Outpatient treatment.  Kala Ambriz will follow up with the services as listed below under Follow Up Information.     Upon completion of this admission the patient was both mentally and medically  stable for discharge denying suicidal/homicidal ideation, auditory/visual/tactile hallucinations, delusional thoughts and paranoia.    Clotilde DieterHannah Uliano responded well to treatment with Celexa, Neurontin and Ambien without adverse effects.Pt demonstrated improvement without reported or observed adverse effects to the point of stability appropriate for outpatient management.  Pertinent labs include: CMP, beta HCG   for which outpatient follow-up is necessary for lab recheck as mentioned below. Reviewed CBC, CMP, BAL, and UDS+ Opiates ; all unremarkable aside from noted exceptions.    Physical Findings: AIMS:  , ,  ,  ,    CIWA:  CIWA-Ar Total: 5 COWS:  COWS Total Score: 0  Musculoskeletal: Strength & Muscle Tone: within normal limits Gait & Station: normal Patient leans: N/A  Psychiatric Specialty Exam: See SRA by MD Physical Exam  Nursing note and vitals reviewed. Constitutional: She is oriented to person, place, and time. She appears well-developed.  Neck: Normal range of motion.  Musculoskeletal: Normal range of motion.  Neurological: She is alert and oriented to person, place, and time.  Psychiatric: She has a normal mood and affect. Her behavior is normal.    Review of Systems  Psychiatric/Behavioral: Negative for suicidal ideas and hallucinations. Depression: stable. Nervous/anxious: stable.   All other systems reviewed and are negative.   Blood pressure 139/87, pulse 66, temperature 98.8 F (37.1 C), temperature source Oral, resp. rate 18, height 5\' 4"  (1.626 m), weight 68.947 kg (152 lb), last menstrual period 07/23/2015, SpO2 100 %.Body mass index is 26.08 kg/(m^2).   Have you used any form of tobacco in the last 30 days? (Cigarettes, Smokeless Tobacco, Cigars, and/or Pipes): Yes  Has this patient used any form of tobacco in the last 30 days? (Cigarettes, Smokeless Tobacco, Cigars, and/or Pipes)Yes, A prescription for an FDA-approved tobacco cessation medication was offered at discharge and the patient refused  Blood Alcohol level:  Lab Results  Component Value Date   ETH <5 08/03/2015   ETH <5 05/29/2015    Metabolic Disorder Labs:  No results found for: HGBA1C, MPG No results found for: PROLACTIN No results found for: CHOL, TRIG, HDL, CHOLHDL, VLDL, LDLCALC  See Psychiatric Specialty Exam and Suicide Risk Assessment completed by  Attending Physician prior to discharge.  Discharge destination:  Home  Is patient on multiple antipsychotic therapies at discharge:  No   Has Patient had three or more failed trials of antipsychotic monotherapy by history:  No  Recommended Plan for Multiple Antipsychotic Therapies: NA  Discharge Instructions    Activity as tolerated - No restrictions    Complete by:  As directed      Diet general    Complete by:  As directed      Discharge instructions    Complete by:  As directed   Take all medications as prescribed. Keep all follow-up appointments as scheduled.  Do not consume alcohol or use illegal drugs while on prescription medications. Report any adverse effects from your medications to your primary care provider promptly.  In the event of recurrent symptoms or worsening symptoms, call 911, a crisis hotline, or go to the nearest emergency department for evaluation.            Medication List    STOP taking these medications        acetaminophen 325 MG tablet  Commonly known as:  TYLENOL     traZODone 100 MG tablet  Commonly known as:  DESYREL      TAKE these medications      Indication  citalopram 10 MG tablet  Commonly known as:  CELEXA  Take 1 tablet (10 mg total) by mouth daily.   Indication:  Depression     gabapentin 100 MG capsule  Commonly known as:  NEURONTIN  Take 2 capsules (200 mg total) by mouth 3 (three) times daily.   Indication:  withdrawal symptoms     hydrOXYzine 25 MG tablet  Commonly known as:  ATARAX/VISTARIL  Take 1 tablet (25 mg total) by mouth every 6 (six) hours as needed for anxiety.   Indication:  Anxiety Neurosis     nicotine 21 mg/24hr patch  Commonly known as:  NICODERM CQ - dosed in mg/24 hours  Place 1 patch (21 mg total) onto the skin daily. For nicotine addiction   Indication:  Nicotine Addiction     zolpidem 5 MG tablet  Commonly known as:  AMBIEN  Take 1 tablet (5 mg total) by mouth at bedtime as needed for sleep.    Indication:  Trouble Sleeping           Follow-up Information    Follow up with [facility]  On 08/10/2015.   Why:  Assessment for possible admission on Monday June 19th at 8am. Bring [place] ID, 2 week supply, & clothing in case of admission.    Contact information:   [address] [place], [place] [address] Phone: [phone]      Follow up with [facility].   Why:  Walk in between 8am-9am Monday through Friday for hospital follow-up/medication management/assessment for counseling services.    Contact information:   [address] [place] [address] Phone: [phone] Fax: [phone]      Follow-up recommendations:  Activity:  as tolerated  Diet:  heart healthy as prdx by the Primary Care Provider  Comments:  Take all medications as prescribed. Keep all follow-up appointments as scheduled.  Do not consume alcohol or use illegal drugs while on prescription medications. Report any adverse effects from your medications to your primary care provider promptly.  In the event of recurrent symptoms or worsening symptoms, call 911, a crisis hotline, or go to the nearest emergency department for evaluation.   Signed: Oneta Rack, NP 08/08/2015, 9:09 AM  I have examined the patient and agree with the discharge plan and findings. I have also done suicide assessment on this patient.

## 2015-08-08 NOTE — [facility] Suicide Risk Assessment (Signed)
[facility]   Principal Problem: Substance induced mood disorder [facility](HCC) Discharge Diagnoses:  Patient Active Problem List   Diagnosis Date Noted  . 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 Moderate episode of recurrent major depressive disorder (HCC) [F33.1]   . Substance induced mood disorder (HCC) [F19.94] 05/30/2015  . Major depression, recurrent (HCC) [F33.9] 05/30/2015  . Polysubstance abuse [F19.10] 05/30/2015    Total Time spent with patient: 30 minutes  Musculoskeletal: Strength & Muscle Tone: within normal limits Gait & Station: normal Patient leans: no lean  Psychiatric Specialty Exam: Review of Systems  Constitutional: Negative for fever.  Cardiovascular: Negative for chest pain.  Skin: Negative for rash.    Blood pressure 114/77, pulse 79, temperature 98.4 F (36.9 C), temperature source Oral, resp. rate 18, height 5\' 4"  (1.626 m), weight 68.947 kg (152 lb), last menstrual period 07/23/2015, SpO2 100 %.Body mass index is 26.08 kg/(m^2).  General Appearance: Casual  Eye Contact::  Good  Speech:  Normal Rate409  Volume:  Normal  Mood:  Euthymic  Affect:  Congruent  Thought Process:  Goal Directed  Orientation:  Full (Time, Place, and Person)  Thought Content:  Logical  Suicidal Thoughts:  No  Homicidal Thoughts:  No  Memory:  Immediate;   Fair Recent;   Fair  Judgement:  Fair  Insight:  Fair  Psychomotor Activity:  Normal  Concentration:  Fair  Recall:  [employer]Fair  Fund of Knowledge:Fair  Language: Fair  Akathisia:  Negative  Handed:  Right  AIMS (if indicated):     Assets:  Desire for Improvement  Sleep:  Number of Hours: 6.5  Cognition: WNL  ADL's:  Intact   Mental Status Per Nursing Assessment::   On Admission:     Demographic Factors:  Low socioeconomic status  Loss Factors: Financial problems/change in socioeconomic status  Historical Factors: Impulsivity  Risk Reduction Factors:   Living with another  person, especially a relative and Positive coping skills or problem solving skills  No suicidal. No psyhosis Cognitive Features That Contribute To Risk:  None    Suicide Risk:  Minimal: No identifiable suicidal ideation.  Patients presenting with no risk factors but with morbid ruminations; may be classified as minimal risk based on the severity of the depressive symptoms  Follow-up Information    [facility] 08[facility]10/2015.   Why:  Assessment for possible admission on Monday June 19th at 8am. Bring [place] ID, 2 week supply, & clothing in case of admission.    Contact information:   [address] [place], [place] [address] Phone: [phone]      [facility]   Why:  Walk in between 8am-9am Monday through Friday for hospital follow-up/medication management/assessment for counseling services.    Contact information:   [address]  [place], [place] [address] Phone: [phone] Fax: [phone]    [facility]. See Discharge summary and recommendations.   Plan Of Care/Follow-up recommendations:  Activity:  as tolerated Diet:  regular  Gilmore LarocheAKHTAR, Toby Ayad, MD 08/08/2015, 10:14 AM

## 2015-08-08 NOTE — BHH Group Notes (Signed)
Adult Therapy Group Note  Date:  08/08/2015 Time:  10:00 AM  Group Topic/Focus:  Today's group focused on identifying a change each patient is considering, their goals which they would want to accomplish with that change, possible obstacles and how they could respond to those obstacles.  We then talked about how they will know when that change is starting to happen.  Motivational Interviewing was used to high light ambivalence and focus on patients' own reasons to change.  Participation Level:  Active  Participation Quality:  Appropriate, Attentive and Sharing  Affect:  Appropriate  Cognitive:  Appropriate  Insight: Good  Engagement in Group:  Engaged  Modes of Intervention:  Exploration and Motivational Interviewing  Additional Comments:  [name] ClientHannah stated that the change she wants to make in her life is taking medications for depression and anxiety, adding that she has always previously been opposed to them. Then she realized she was using other substances to self-medicate and they were not helping, but were making everything worse.  She left group to be discharged.  [name], Ave [address] Jo 08/08/2015, 11:18 AM

## 2015-10-30 ENCOUNTER — Inpatient Hospital Stay (HOSPITAL_COMMUNITY): Payer: Self-pay

## 2015-10-30 ENCOUNTER — Encounter (HOSPITAL_COMMUNITY): Payer: Self-pay | Admitting: Emergency Medicine

## 2015-10-30 ENCOUNTER — Emergency Department (HOSPITAL_COMMUNITY): Payer: Self-pay

## 2015-10-30 ENCOUNTER — Inpatient Hospital Stay (HOSPITAL_COMMUNITY)
Admission: EM | Admit: 2015-10-30 | Discharge: 2015-11-07 | DRG: 917 | Disposition: A | Discharge status: Home / Self Care | Payer: Self-pay | Attending: Internal Medicine | Admitting: Internal Medicine

## 2015-10-30 DIAGNOSIS — J690 Pneumonitis due to inhalation of food and vomit: Secondary | ICD-10-CM | POA: Diagnosis present

## 2015-10-30 DIAGNOSIS — G9340 Encephalopathy, unspecified: Secondary | ICD-10-CM | POA: Diagnosis present

## 2015-10-30 DIAGNOSIS — Y92049 Unspecified place in boarding-house as the place of occurrence of the external cause: Secondary | ICD-10-CM

## 2015-10-30 DIAGNOSIS — E878 Other disorders of electrolyte and fluid balance, not elsewhere classified: Secondary | ICD-10-CM | POA: Diagnosis present

## 2015-10-30 DIAGNOSIS — F988 Other specified behavioral and emotional disorders with onset usually occurring in childhood and adolescence: Secondary | ICD-10-CM | POA: Diagnosis present

## 2015-10-30 DIAGNOSIS — L03221 Cellulitis of neck: Secondary | ICD-10-CM | POA: Diagnosis present

## 2015-10-30 DIAGNOSIS — L0390 Cellulitis, unspecified: Secondary | ICD-10-CM | POA: Insufficient documentation

## 2015-10-30 DIAGNOSIS — F1110 Opioid abuse, uncomplicated: Secondary | ICD-10-CM | POA: Diagnosis present

## 2015-10-30 DIAGNOSIS — J9601 Acute respiratory failure with hypoxia: Secondary | ICD-10-CM | POA: Diagnosis present

## 2015-10-30 DIAGNOSIS — M6282 Rhabdomyolysis: Secondary | ICD-10-CM | POA: Diagnosis present

## 2015-10-30 DIAGNOSIS — J9690 Respiratory failure, unspecified, unspecified whether with hypoxia or hypercapnia: Secondary | ICD-10-CM

## 2015-10-30 DIAGNOSIS — F329 Major depressive disorder, single episode, unspecified: Secondary | ICD-10-CM | POA: Diagnosis present

## 2015-10-30 DIAGNOSIS — R4182 Altered mental status, unspecified: Secondary | ICD-10-CM | POA: Diagnosis present

## 2015-10-30 DIAGNOSIS — E876 Hypokalemia: Secondary | ICD-10-CM | POA: Diagnosis not present

## 2015-10-30 DIAGNOSIS — Z79899 Other long term (current) drug therapy: Secondary | ICD-10-CM

## 2015-10-30 DIAGNOSIS — Z915 Personal history of self-harm: Secondary | ICD-10-CM

## 2015-10-30 DIAGNOSIS — R042 Hemoptysis: Secondary | ICD-10-CM | POA: Diagnosis not present

## 2015-10-30 DIAGNOSIS — R339 Retention of urine, unspecified: Secondary | ICD-10-CM | POA: Diagnosis present

## 2015-10-30 DIAGNOSIS — E162 Hypoglycemia, unspecified: Secondary | ICD-10-CM | POA: Diagnosis not present

## 2015-10-30 DIAGNOSIS — R401 Stupor: Secondary | ICD-10-CM

## 2015-10-30 DIAGNOSIS — A419 Sepsis, unspecified organism: Secondary | ICD-10-CM | POA: Insufficient documentation

## 2015-10-30 DIAGNOSIS — K7200 Acute and subacute hepatic failure without coma: Secondary | ICD-10-CM | POA: Diagnosis present

## 2015-10-30 DIAGNOSIS — T401X1A Poisoning by heroin, accidental (unintentional), initial encounter: Principal | ICD-10-CM | POA: Diagnosis present

## 2015-10-30 DIAGNOSIS — N179 Acute kidney failure, unspecified: Secondary | ICD-10-CM | POA: Diagnosis present

## 2015-10-30 DIAGNOSIS — Z4659 Encounter for fitting and adjustment of other gastrointestinal appliance and device: Secondary | ICD-10-CM

## 2015-10-30 DIAGNOSIS — F17210 Nicotine dependence, cigarettes, uncomplicated: Secondary | ICD-10-CM | POA: Diagnosis present

## 2015-10-30 LAB — GLUCOSE, CAPILLARY
GLUCOSE-CAPILLARY: 134 mg/dL — AB (ref 65–99)
Glucose-Capillary: 109 mg/dL — ABNORMAL HIGH (ref 65–99)
Glucose-Capillary: 138 mg/dL — ABNORMAL HIGH (ref 65–99)
Glucose-Capillary: 135 mg/dL — ABNORMAL HIGH (ref 65–99)
Glucose-Capillary: 143 mg/dL — ABNORMAL HIGH (ref 65–99)

## 2015-10-30 LAB — I-STAT BETA HCG BLOOD, ED (MC, WL, AP ONLY): I-stat hCG, quantitative: <5 m[IU]/mL (ref <5)

## 2015-10-30 LAB — I-STAT ARTERIAL BLOOD GAS, ED
ACID-BASE DEFICIT: 5 mmol/L — AB (ref 0.0–2.0)
BICARBONATE: 22.6 mmol/L (ref 20.0–28.0)
O2 SAT: 99 %
PH ART: 7.262 — AB (ref 7.350–7.450)
TCO2: 24 mmol/L (ref 0–100)
pCO2 arterial: 50.2 mmHg — ABNORMAL HIGH (ref 32.0–48.0)
pO2, Arterial: 178 mmHg — ABNORMAL HIGH (ref 83.0–108.0)

## 2015-10-30 LAB — COMPREHENSIVE METABOLIC PANEL
ALT: 180 U/L — AB (ref 14–54)
AST: 470 U/L — AB (ref 15–41)
Albumin: 3.3 g/dL — ABNORMAL LOW (ref 3.5–5.0)
Alkaline Phosphatase: 122 U/L (ref 38–126)
Anion gap: 10 (ref 5–15)
BUN: 14 mg/dL (ref 6–20)
CHLORIDE: 105 mmol/L (ref 101–111)
CO2: 20 mmol/L — AB (ref 22–32)
Calcium: 8 mg/dL — ABNORMAL LOW (ref 8.9–10.3)
Creatinine, Ser: 1.21 mg/dL — ABNORMAL HIGH (ref 0.44–1.00)
GFR, EST NON AFRICAN AMERICAN: 60 mL/min — AB (ref >60)
Glucose, Bld: 158 mg/dL — ABNORMAL HIGH (ref 65–99)
POTASSIUM: 4.6 mmol/L (ref 3.5–5.1)
SODIUM: 135 mmol/L (ref 135–145)
Total Bilirubin: 0.6 mg/dL (ref 0.3–1.2)
Total Protein: 5.9 g/dL — ABNORMAL LOW (ref 6.5–8.1)

## 2015-10-30 LAB — CBG MONITORING, ED
GLUCOSE-CAPILLARY: 60 mg/dL — AB (ref 65–99)
Glucose-Capillary: 95 mg/dL (ref 65–99)
Glucose-Capillary: 107 mg/dL — ABNORMAL HIGH (ref 65–99)
Glucose-Capillary: 124 mg/dL — ABNORMAL HIGH (ref 65–99)

## 2015-10-30 LAB — URINE MICROSCOPIC-ADD ON

## 2015-10-30 LAB — URINALYSIS, ROUTINE W REFLEX MICROSCOPIC
BILIRUBIN URINE: NEGATIVE
Glucose, UA: 100 mg/dL — AB
Ketones, ur: NEGATIVE mg/dL
NITRITE: NEGATIVE
PH: 5.5 (ref 5.0–8.0)
Protein, ur: 100 mg/dL — AB
SPECIFIC GRAVITY, URINE: 1.036 — AB (ref 1.005–1.030)

## 2015-10-30 LAB — CBC WITH DIFFERENTIAL/PLATELET
BASOS ABS: 0 10*3/uL (ref 0.0–0.1)
Basophils Relative: 0 %
Eosinophils Absolute: 0 10*3/uL (ref 0.0–0.7)
Eosinophils Relative: 0 %
HCT: 45.5 % (ref 36.0–46.0)
Hemoglobin: 15.8 g/dL — ABNORMAL HIGH (ref 12.0–15.0)
LYMPHS ABS: 0.9 10*3/uL (ref 0.7–4.0)
Lymphocytes Relative: 4 %
MCH: 32 pg (ref 26.0–34.0)
MCHC: 34.7 g/dL (ref 30.0–36.0)
MCV: 92.3 fL (ref 78.0–100.0)
MONO ABS: 1.7 10*3/uL — AB (ref 0.1–1.0)
MONOS PCT: 8 %
NEUTROS ABS: 19.2 10*3/uL — AB (ref 1.7–7.7)
Neutrophils Relative %: 88 %
PLATELETS: 298 10*3/uL (ref 150–400)
RBC: 4.93 MIL/uL (ref 3.87–5.11)
RDW: 12.8 % (ref 11.5–15.5)
WBC: 21.8 10*3/uL — AB (ref 4.0–10.5)

## 2015-10-30 LAB — RAPID URINE DRUG SCREEN, HOSP PERFORMED
Amphetamines: NOT DETECTED
BENZODIAZEPINES: POSITIVE — AB
Barbiturates: NOT DETECTED
COCAINE: NOT DETECTED
OPIATES: POSITIVE — AB
Tetrahydrocannabinol: NOT DETECTED

## 2015-10-30 LAB — PROTIME-INR
INR: 1.27
PROTHROMBIN TIME: 16 s — AB (ref 11.4–15.2)

## 2015-10-30 LAB — LACTIC ACID, PLASMA: Lactic Acid, Venous: 1.9 mmol/L (ref 0.5–1.9)

## 2015-10-30 LAB — MAGNESIUM
Magnesium: 1.5 mg/dL — ABNORMAL LOW (ref 1.7–2.4)
Magnesium: 1.7 mg/dL (ref 1.7–2.4)

## 2015-10-30 LAB — PHOSPHORUS
Phosphorus: 3.3 mg/dL (ref 2.5–4.6)
Phosphorus: 3 mg/dL (ref 2.5–4.6)

## 2015-10-30 LAB — TRIGLYCERIDES: TRIGLYCERIDES: 191 mg/dL — AB (ref <150)

## 2015-10-30 LAB — ACETAMINOPHEN LEVEL: Acetaminophen (Tylenol), Serum: <10 ug/mL — ABNORMAL LOW (ref 10–30)

## 2015-10-30 LAB — CK: Total CK: 48997 U/L — ABNORMAL HIGH (ref 38–234)

## 2015-10-30 LAB — MRSA PCR SCREENING: MRSA BY PCR: NEGATIVE

## 2015-10-30 LAB — I-STAT CG4 LACTIC ACID, ED: Lactic Acid, Venous: 1.92 mmol/L (ref 0.5–1.9)

## 2015-10-30 LAB — STREP PNEUMONIAE URINARY ANTIGEN: STREP PNEUMO URINARY ANTIGEN: NEGATIVE

## 2015-10-30 LAB — ETHANOL

## 2015-10-30 LAB — SALICYLATE LEVEL

## 2015-10-30 MED ORDER — DEXTROSE 50 % IV SOLN
1.0000 | Freq: Once | INTRAVENOUS | Status: AC
  Administered 2015-10-30: 50 mL via INTRAVENOUS

## 2015-10-30 MED ORDER — VANCOMYCIN HCL IN DEXTROSE 1-5 GM/200ML-% IV SOLN
1000.0000 mg | Freq: Once | INTRAVENOUS | Status: AC
  Administered 2015-10-30: 1000 mg via INTRAVENOUS
  Filled 2015-10-30: qty 200

## 2015-10-30 MED ORDER — ENOXAPARIN SODIUM 40 MG/0.4ML ~~LOC~~ SOLN: 40.0000 mg | SUBCUTANEOUS | Status: DC

## 2015-10-30 MED ORDER — ACETAMINOPHEN 160 MG/5ML PO SOLN
650.0000 mg | Freq: Four times a day (QID) | ORAL | Status: DC | PRN
  Administered 2015-10-31: 650 mg
  Filled 2015-10-30: qty 20.3

## 2015-10-30 MED ORDER — MIDAZOLAM HCL 2 MG/2ML IJ SOLN
INTRAMUSCULAR | Status: AC
  Filled 2015-10-30: qty 6

## 2015-10-30 MED ORDER — NALOXONE HCL 0.4 MG/ML IJ SOLN
INTRAMUSCULAR | Status: AC
  Filled 2015-10-30: qty 1

## 2015-10-30 MED ORDER — PROPOFOL 1000 MG/100ML IV EMUL
INTRAVENOUS | Status: DC | PRN
  Administered 2015-10-30 (×5): via INTRAVENOUS
  Administered 2015-10-30: 10 ug/kg/min via INTRAVENOUS
  Administered 2015-10-30: 11:00:00 via INTRAVENOUS
  Administered 2015-10-30: 50 ug/kg/min via INTRAVENOUS

## 2015-10-30 MED ORDER — ONDANSETRON HCL 4 MG/2ML IJ SOLN
4.0000 mg | Freq: Four times a day (QID) | INTRAMUSCULAR | Status: DC | PRN
  Filled 2015-10-30: qty 2

## 2015-10-30 MED ORDER — PROPOFOL 1000 MG/100ML IV EMUL
0.0000 ug/kg/min | INTRAVENOUS | Status: DC
Start: 2015-10-30 — End: 2015-10-31
  Administered 2015-10-30 (×2): 50 ug/kg/min via INTRAVENOUS
  Administered 2015-10-30: 49.02 ug/kg/min via INTRAVENOUS
  Administered 2015-10-31 (×3): 50 ug/kg/min via INTRAVENOUS
  Filled 2015-10-30 (×8): qty 100

## 2015-10-30 MED ORDER — IOPAMIDOL (ISOVUE-300) INJECTION 61%
INTRAVENOUS | Status: AC
  Administered 2015-10-30: 75 mL
  Filled 2015-10-30: qty 100

## 2015-10-30 MED ORDER — ASPIRIN 300 MG RE SUPP: 300.0000 mg | RECTAL | Status: DC

## 2015-10-30 MED ORDER — PIPERACILLIN-TAZOBACTAM 3.375 G IVPB
3.3750 g | Freq: Three times a day (TID) | INTRAVENOUS | Status: DC
  Administered 2015-10-30 – 2015-11-02 (×8): 3.375 g via INTRAVENOUS
  Filled 2015-10-30 (×11): qty 50

## 2015-10-30 MED ORDER — FAMOTIDINE 40 MG/5ML PO SUSR
20.0000 mg | Freq: Two times a day (BID) | ORAL | Status: DC
  Administered 2015-10-30 – 2015-10-31 (×3): 20 mg
  Filled 2015-10-30 (×4): qty 2.5

## 2015-10-30 MED ORDER — NALOXONE HCL 0.4 MG/ML IJ SOLN
0.4000 mg | Freq: Once | INTRAMUSCULAR | Status: AC
  Administered 2015-10-30: 0.4 mg via INTRAVENOUS

## 2015-10-30 MED ORDER — LORAZEPAM 2 MG/ML IJ SOLN
0.5000 mg | Freq: Once | INTRAMUSCULAR | Status: AC
  Administered 2015-10-30: 0.5 mg via INTRAVENOUS
  Filled 2015-10-30: qty 1

## 2015-10-30 MED ORDER — PROPOFOL 1000 MG/100ML IV EMUL
INTRAVENOUS | Status: AC
  Filled 2015-10-30: qty 100

## 2015-10-30 MED ORDER — DOCUSATE SODIUM 50 MG/5ML PO LIQD: 100.0000 mg | Freq: Two times a day (BID) | ORAL | Status: DC | PRN

## 2015-10-30 MED ORDER — ONDANSETRON HCL 4 MG/2ML IJ SOLN
4.0000 mg | Freq: Once | INTRAMUSCULAR | Status: AC
  Administered 2015-10-30: 4 mg via INTRAVENOUS
  Filled 2015-10-30: qty 2

## 2015-10-30 MED ORDER — THIAMINE HCL 100 MG/ML IJ SOLN
100.0000 mg | Freq: Every day | INTRAMUSCULAR | Status: DC
  Administered 2015-10-30 – 2015-11-03 (×5): 100 mg via INTRAVENOUS
  Filled 2015-10-30 (×5): qty 2

## 2015-10-30 MED ORDER — DIPHENHYDRAMINE HCL 50 MG/ML IJ SOLN
INTRAMUSCULAR | Status: AC
  Filled 2015-10-30: qty 1

## 2015-10-30 MED ORDER — CHLORHEXIDINE GLUCONATE 0.12 % MT SOLN
15.0000 mL | Freq: Two times a day (BID) | OROMUCOSAL | Status: DC
  Administered 2015-10-30 – 2015-11-03 (×9): 15 mL via OROMUCOSAL
  Filled 2015-10-30 (×6): qty 15

## 2015-10-30 MED ORDER — ASPIRIN 81 MG PO CHEW: 324.0000 mg | CHEWABLE_TABLET | ORAL | Status: DC

## 2015-10-30 MED ORDER — SUCCINYLCHOLINE CHLORIDE 20 MG/ML IJ SOLN
INTRAMUSCULAR | Status: DC | PRN
  Administered 2015-10-30: 100 mg via INTRAVENOUS

## 2015-10-30 MED ORDER — DEXTROSE 10 % IV SOLN
INTRAVENOUS | Status: DC
  Administered 2015-10-30: 10:00:00 via INTRAVENOUS

## 2015-10-30 MED ORDER — FENTANYL CITRATE (PF) 100 MCG/2ML IJ SOLN
100.0000 ug | INTRAMUSCULAR | Status: DC | PRN
  Administered 2015-10-31 – 2015-11-01 (×11): 100 ug via INTRAVENOUS
  Filled 2015-10-30 (×9): qty 2

## 2015-10-30 MED ORDER — DEXTROSE 50 % IV SOLN
INTRAVENOUS | Status: AC
  Administered 2015-10-30: 50 mL via INTRAVENOUS
  Filled 2015-10-30: qty 50

## 2015-10-30 MED ORDER — METHYLPREDNISOLONE SODIUM SUCC 125 MG IJ SOLR
INTRAMUSCULAR | Status: AC
  Filled 2015-10-30: qty 2

## 2015-10-30 MED ORDER — NALOXONE HCL 2 MG/2ML IJ SOSY
PREFILLED_SYRINGE | INTRAMUSCULAR | Status: AC
  Filled 2015-10-30: qty 2

## 2015-10-30 MED ORDER — VITAL HIGH PROTEIN PO LIQD
1000.0000 mL | ORAL | Status: DC
  Administered 2015-10-30: 1000 mL
  Administered 2015-10-30 – 2015-10-31 (×2)
  Administered 2015-10-31: 1000 mL
  Administered 2015-10-31 (×6)

## 2015-10-30 MED ORDER — FENTANYL CITRATE (PF) 100 MCG/2ML IJ SOLN
100.0000 ug | INTRAMUSCULAR | Status: AC | PRN
  Administered 2015-10-30 – 2015-10-31 (×3): 100 ug via INTRAVENOUS
  Filled 2015-10-30 (×5): qty 2

## 2015-10-30 MED ORDER — ORAL CARE MOUTH RINSE
15.0000 mL | Freq: Four times a day (QID) | OROMUCOSAL | Status: DC
  Administered 2015-10-31 – 2015-11-04 (×16): 15 mL via OROMUCOSAL

## 2015-10-30 MED ORDER — SODIUM CHLORIDE 0.9 % IV BOLUS (SEPSIS)
1000.0000 mL | Freq: Once | INTRAVENOUS | Status: AC
  Administered 2015-10-30: 1000 mL via INTRAVENOUS

## 2015-10-30 MED ORDER — BISACODYL 10 MG RE SUPP: 10.0000 mg | Freq: Every day | RECTAL | Status: DC | PRN

## 2015-10-30 MED ORDER — HEPARIN SODIUM (PORCINE) 5000 UNIT/ML IJ SOLN
5000.0000 [IU] | Freq: Three times a day (TID) | INTRAMUSCULAR | Status: DC
  Administered 2015-10-30 – 2015-11-07 (×24): 5000 [IU] via SUBCUTANEOUS
  Filled 2015-10-30 (×24): qty 1

## 2015-10-30 MED ORDER — ETOMIDATE 2 MG/ML IV SOLN
INTRAVENOUS | Status: DC | PRN
  Administered 2015-10-30: 20 mg via INTRAVENOUS

## 2015-10-30 MED ORDER — SODIUM CHLORIDE 0.9 % IV SOLN: 250.0000 mL | INTRAVENOUS | Status: DC | PRN

## 2015-10-30 MED ORDER — PIPERACILLIN-TAZOBACTAM 3.375 G IVPB
3.3750 g | Freq: Once | INTRAVENOUS | Status: AC
  Administered 2015-10-30: 3.375 g via INTRAVENOUS
  Filled 2015-10-30: qty 50

## 2015-10-30 MED ORDER — FOLIC ACID 5 MG/ML IJ SOLN
1.0000 mg | Freq: Every day | INTRAMUSCULAR | Status: DC
  Administered 2015-10-30 – 2015-11-03 (×5): 1 mg via INTRAVENOUS
  Filled 2015-10-30 (×5): qty 0.2

## 2015-10-30 MED ORDER — DEXTROSE-NACL 5-0.9 % IV SOLN
INTRAVENOUS | Status: DC
  Administered 2015-10-30 – 2015-11-01 (×3): via INTRAVENOUS

## 2015-10-30 MED ORDER — SODIUM CHLORIDE 0.9 % IV SOLN
INTRAVENOUS | Status: DC
  Administered 2015-10-30: 13:00:00 via INTRAVENOUS

## 2015-10-30 MED ORDER — MIDAZOLAM HCL 5 MG/5ML IJ SOLN
INTRAMUSCULAR | Status: DC | PRN
  Administered 2015-10-30: 3 mg via INTRAVENOUS

## 2015-10-30 MED ORDER — PIPERACILLIN-TAZOBACTAM 3.375 G IVPB: 3.3750 g | Freq: Once | INTRAVENOUS | Status: DC

## 2015-10-30 MED ORDER — VANCOMYCIN HCL IN DEXTROSE 750-5 MG/150ML-% IV SOLN
750.0000 mg | Freq: Two times a day (BID) | INTRAVENOUS | Status: DC
  Administered 2015-10-31: 750 mg via INTRAVENOUS
  Filled 2015-10-30 (×2): qty 150

## 2015-10-30 MED ORDER — PRO-STAT SUGAR FREE PO LIQD
30.0000 mL | Freq: Two times a day (BID) | ORAL | Status: DC
  Administered 2015-10-30 – 2015-10-31 (×3): 30 mL
  Filled 2015-10-30 (×4): qty 30

## 2015-10-30 NOTE — Progress Notes (Signed)
eLink Physician-Brief Progress Note Patient Name: Traci DieterHannah [name] DOB: 08/30/1985 MRN: [id number]   Date of Service  10/30/2015  HPI/Events of Note  Notified by bedside nurse of ongoing fever. Currently on broad-spectrum antibiotics. Cultures obtained on 9/8.   eICU Interventions  1. Continuing broad-spectrum antibiotics 2. Tylenol for fever suppression via tube      Intervention Category Intermediate Interventions: Infection - evaluation and management  Lawanda CousinsJennings Nestor 10/30/2015, 11:37 PM

## 2015-10-30 NOTE — ED Notes (Signed)
Pt's CBG 60. Informed Chrislyn, RN.

## 2015-10-30 NOTE — H&P (Signed)
PULMONARY / CRITICAL CARE MEDICINE   Name: Traci [name] MRN: [id number] DOB: [birth year]    ADMISSION DATE:  10/30/2015   REFERRING MD: EDP  CHIEF COMPLAINT:  Pna resp failure  HISTORY OF PRESENT ILLNESS:   30 yo with hx of substance abuse, suicide attempt in past, living in boarding hose and found in resp distress 9/8 and EMS was called. Transported to [facility] ED and was interactive but lethargic. GIVEN NARCAN AND BECAME COMBATIVE. Unable to maintain O2 sats and she was intubated per EDP. Noted to have hemoptysis post intubation. Cxr reveals extensive opacities left lung. Noted to have vomitus on shirt by ems. She was notable for red raised area left neck and left buttock. Old slice marks were noted on left wrist. No needle marks were found.She will be admitted to ICU after CT neck/head, broad spectrum abx, MVS, pan culture and toxinology screen.  PAST MEDICAL HISTORY :  She  has a past medical history of ADD (attention deficit disorder); Depression; and Substance abuse.  PAST SURGICAL HISTORY: She  has no past surgical history on file.  Allergies  Allergen Reactions  . Zinc Oxide Itching and Swelling    No current facility-administered medications on file prior to encounter.    Current Outpatient Prescriptions on File Prior to Encounter  Medication Sig  . citalopram (CELEXA) 10 MG tablet Take 1 tablet (10 mg total) by mouth daily.  Marland Kitchen gabapentin (NEURONTIN) 100 MG capsule Take 2 capsules (200 mg total) by mouth 3 (three) times daily.  . hydrOXYzine (ATARAX/VISTARIL) 25 MG tablet Take 1 tablet (25 mg total) by mouth every 6 (six) hours as needed for anxiety.  . nicotine (NICODERM CQ - DOSED IN MG/24 HOURS) 21 mg/24hr patch Place 1 patch (21 mg total) onto the skin daily. For nicotine addiction (Patient not taking: Reported on 08/03/2015)  . zolpidem (AMBIEN) 5 MG tablet Take 1 tablet (5 mg total) by mouth at bedtime as needed for sleep.    FAMILY HISTORY:  Her has no family status  information on file.    SOCIAL HISTORY: She  reports that she has been smoking Cigarettes.  She has a 10.00 pack-year smoking history. She does not have any smokeless tobacco history on file. She reports that she uses drugs, including Heroin. She reports that she does not drink alcohol.  REVIEW OF SYSTEMS:   NA  SUBJECTIVE:  Sedated on vent  VITAL SIGNS: Wt 150 lb (68 kg)   LMP  (LMP Unknown)   SpO2 (!) 77%   BMI 25.75 kg/m   HEMODYNAMICS:    VENTILATOR SETTINGS:    INTAKE / OUTPUT: No intake/output data recorded.  PHYSICAL EXAMINATION: General:  WM , just sedated for intubation, WNWD Neuro: sedated HEENT:  No JVD, raised and reddened area left neck, PErl Cardiovascular: hsr rrr Lungs:  Coarse rhonchi left Abdomen:  Soft +bs Musculoskeletal:  intact Skin:  Left neck and left buttock raised an\d reddened area  LABS:  BMET  Recent Labs Lab 10/30/15 0953  NA 135  K 4.6  CL 105  CO2 20*  BUN 14  CREATININE 1.21*  GLUCOSE 158*    Electrolytes  Recent Labs Lab 10/30/15 0953  CALCIUM 8.0*    CBC  Recent Labs Lab 10/30/15 0953  WBC 21.8*  HGB 15.8*  HCT 45.5  PLT 298    Coag's No results for input(s): APTT, INR in the last 168 hours.  Sepsis Markers No results for input(s): LATICACIDVEN, PROCALCITON, O2SATVEN in the last  168 hours.  ABG No results for input(s): PHART, PCO2ART, PO2ART in the last 168 hours.  Liver Enzymes  Recent Labs Lab 10/30/15 0953  AST 470*  ALT 180*  ALKPHOS 122  BILITOT 0.6  ALBUMIN 3.3*    Cardiac Enzymes No results for input(s): TROPONINI, PROBNP in the last 168 hours.  Glucose  Recent Labs Lab 10/30/15 0926  GLUCAP 60*    Imaging Dg Chest Port 1 View  Result Date: 10/30/2015 CLINICAL DATA:  Hypoxia, substance abuse, current smoker. EXAM: PORTABLE CHEST 1 VIEW COMPARISON:  PA and lateral chest x-ray of Jun 29, 2013 FINDINGS: Diffuse interstitial and confluent alveolar opacities are present  in the left lung. The right lung is clear. The heart is top-normal in size. The pulmonary vascularity is not engorged. There is no pleural effusion or pneumothorax. The observed bony thorax is normal. IMPRESSION: Interstitial and alveolar opacities throughout the left lung most compatible with pneumonia. Asymmetric pulmonary edema is possible but felt less likely. Mild cardiomegaly without definite pulmonary vascular congestion. Electronically Signed   By: David  SwazilandJordan M.D.   On: 10/30/2015 10:00     STUDIES:  9/8 ct head>> 9/8 ct neck>>  CULTURES: 9/8 bc x 2>> 9/8 uc>> 9/8 sputum>> 9/8 urine legionella>>  ANTIBIOTICS: \9/8 vanc>> 9/8 zoysn>>  SIGNIFICANT EVENTS: 9/8 intubated in er  LINES/TUBES: 9/8 ett>> 9/8 OGT>>  DISCUSSION: 30 yo with hx of substance abuse, suicide attempt in past, living in boarding hose and found in resp distress 9/8 and EMS was called. [facility] and was interactive but lethargic. GIVEN NARCAN AND BECAME COMBATIVE. Unable to maintain O2 sats and she was intubated per EDP. Noted to have hemoptysis post intubation. Cxr reveals extensive opacities left lung. Noted to have vomitus on shirt by ems. She was notable for red raised area left neck and left buttock. Old slice marks were noted on left wrist. No needle marks were found.She will be admitted to ICU after CT neck/head, broad spectrum abx, MVS, pan culture and toxinology screen.  ASSESSMENT / PLAN:  PULMONARY A: Acute resp failure with hypoxia Presumed aspiration pna Hemoptysis post intubation P:   Vent bundle Abx Careful with any anticoagulation BD's as needed  CARDIOVASCULAR A:  HD stable  P:  Consider 2D pending culture review Fluids as needed  RENAL Lab Results  Component Value Date   CREATININE 1.21 (H) 10/30/2015   CREATININE 0.56 08/03/2015   CREATININE 0.82 05/29/2015    A:   Renal insuff P:   Gentle hydration   GASTROINTESTINAL A:   GIP ? Shock liver P:    OGT TF Check LFT Check INR/PT  HEMATOLOGIC A:   NO acute issue P:  DVT protection Check for rhamdo  INFECTIOUS A:   Presumed multilobar pna(?aspiration, found with vomit on shirt)left side chest white out Suspected rt clavicular/neck cellulitis P:   Pan culture V/Z 0/X Neck ct  ENDOCRINE A:   Hypoglycemia P:   Monitor glucose  NEUROLOGIC A:   Lethargic on admit but conversant and followed comands Combative with narcan injection Sedated on vent after intubation for hypoxia Hx of substance ab use P:   RASS goal: -1 Sedation protocol Thiamine and folic acid   FAMILY  - Updates:   - Inter-disciplinary family meet or Palliative Care meeting due by:  9/16    Brett CanalesSteve Minor ACNP [facility] Pager [phone] till 3 pm If no answer page [phone] 10/30/2015, 11:00 AM  30 yo female brought to ER  with altered mental status.  Given narcan and became combative.  Had low oxygen and coughing up bloody sputum.  Had Lt PNA.  Intubated.  Noted to have induration and erythema Lt neck.  She has hx of substance abuse and suicide attempts.  Seen in ER.  On vent.  Warm/red induration Lt neck area.  B/l crackles Lt > Rt.  Abd soft.  Pressure bruised Lt hip area.    CT neck >> possible cellulitis/myositis Lt SCM muscle CT chest >> ASD LT > RT CT head >> negative  LA 1.9, WBC 21.8, Creatinine 1.21.  Assessment/plan:  Sepsis from aspiration PNA and possible Lt neck cellulitis. - Abx - surgery consult - f/u cultures  Acute respiratory failure with hypoxia. - full vent support - f/u CXR, ABG  AKI. - IV fluid  Hypoglycemia. - dextrose in IV fluids  Elevated LFTs. - f/u LFTs  CC time by me independent of APP time is 38 minutes.  Coralyn Helling, MD [facility] Pulmonary/Critical Care 10/30/2015, 12:57 PM Pager:  [phone] After 3pm call: [phone]
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
## 2015-10-30 NOTE — ED Notes (Signed)
Pt's CBG 95.  Informed Chrislyn, RN.

## 2015-10-30 NOTE — ED Notes (Signed)
Pt's CBG 124.  Informed Chrislyn, RN.

## 2015-10-30 NOTE — ED Notes (Signed)
Dr. Craige CottaSood MD at bedside.

## 2015-10-30 NOTE — ED Notes (Signed)
Attempted to call report

## 2015-10-30 NOTE — Progress Notes (Signed)
eLink Physician-Brief Progress Note Patient Name: Traci DieterHannah Lashomb DOB: 05/14/1985 MRN: [id number]   Date of Service  10/30/2015  HPI/Events of Note  Urinary retention - bladder scan with 1000 mL residual.   eICU Interventions  Will order Foley catheter placed.      Intervention Category Intermediate Interventions: Other:  Lenell AntuSommer,Allea Kassner Eugene 10/30/2015, 4:48 PM
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
## 2015-10-30 NOTE — ED Notes (Signed)
Pt's CBG 107.  Informed Shanda BumpsJessica, RN.
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
## 2015-10-30 NOTE — Progress Notes (Signed)
Pharmacy Antibiotic Note  Traci [name] is a 30 y.o. female admitted on 10/30/2015 with pneumonia.  Pharmacy has been consulted for vancomycin and zosyn dosing. WBC is elevated at 21.8 and Scr is mildly elevated at 1.21. Lactic acid is mildly elevated at 1.92. First doses per MD.  Plan: - Vancomycin 1gm IV x 1 then 750mg  IV Q12H - Zosyn 3.375gm IV Q8H (4 hr inf) - F/u renal fxn, C&S, clinical status and trough at SS  Weight: 150 lb (68 kg)  No data recorded.   Recent Labs Lab 10/30/15 0953 10/30/15 1156 10/30/15 1212  WBC 21.8*  --   --   CREATININE 1.21*  --   --   LATICACIDVEN  --  1.9 1.92*    Estimated Creatinine Clearance: 65 mL/min (by C-G formula based on SCr of 1.21 mg/dL).    Allergies  Allergen Reactions  . Zinc Oxide Itching and Swelling    Antimicrobials this admission: Vanc 9/8>> Zosyn 9/8>>  Dose adjustments this admission: N/A  Microbiology results: Pending  Thank you for allowing pharmacy to be a part of this patient's care.  Tanayah Squitieri, Drake LeachRachel Lynn 10/30/2015 1:11 PM
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
## 2015-10-30 NOTE — Progress Notes (Signed)
eLink Physician-Brief Progress Note Patient Name: Clotilde DieterHannah Aguayo DOB: 12/21/1985 MRN: [id number]   Date of Service  10/30/2015  HPI/Events of Note  Notified by bedside nurse the patient now mildly hyperglycemic. Currently on Accu-Cheks. Status post enteric feeding tube placement.   eICU Interventions  1. Ordering abdominal x-ray for confirmation of enteric feeding tube placement/position 2. Continuing Accu-Cheks every 4 hours with parameters to notify M.D. 3. Holding off on initiating sliding scale insulin      Intervention Category Intermediate Interventions: Hyperglycemia - evaluation and treatment  Lawanda CousinsJennings Allisha Harter 10/30/2015, 9:39 PM

## 2015-10-31 ENCOUNTER — Inpatient Hospital Stay (HOSPITAL_COMMUNITY): Payer: Self-pay

## 2015-10-31 LAB — GLUCOSE, CAPILLARY
GLUCOSE-CAPILLARY: 120 mg/dL — AB (ref 65–99)
GLUCOSE-CAPILLARY: 116 mg/dL — AB (ref 65–99)
GLUCOSE-CAPILLARY: 125 mg/dL — AB (ref 65–99)
GLUCOSE-CAPILLARY: 118 mg/dL — AB (ref 65–99)
Glucose-Capillary: 126 mg/dL — ABNORMAL HIGH (ref 65–99)
Glucose-Capillary: 152 mg/dL — ABNORMAL HIGH (ref 65–99)

## 2015-10-31 LAB — BASIC METABOLIC PANEL
ANION GAP: 8 (ref 5–15)
BUN: 11 mg/dL (ref 6–20)
CO2: 20 mmol/L — AB (ref 22–32)
Calcium: 7.7 mg/dL — ABNORMAL LOW (ref 8.9–10.3)
Chloride: 108 mmol/L (ref 101–111)
Creatinine, Ser: 0.74 mg/dL (ref 0.44–1.00)
GLUCOSE: 120 mg/dL — AB (ref 65–99)
Potassium: 4 mmol/L (ref 3.5–5.1)
Sodium: 136 mmol/L (ref 135–145)

## 2015-10-31 LAB — BLOOD GAS, ARTERIAL
ACID-BASE DEFICIT: 2 mmol/L (ref 0.0–2.0)
BICARBONATE: 21.8 mmol/L (ref 20.0–28.0)
DRAWN BY: 460981
FIO2: 40
LHR: 22 {breaths}/min
MECHVT: 500 mL
O2 SAT: 96 %
PEEP/CPAP: 5 cmH2O
PH ART: 7.423 (ref 7.350–7.450)
Patient temperature: 98.6
pCO2 arterial: 34 mmHg (ref 32.0–48.0)
pO2, Arterial: 81.6 mmHg — ABNORMAL LOW (ref 83.0–108.0)

## 2015-10-31 LAB — HEPATIC FUNCTION PANEL
ALK PHOS: 96 U/L (ref 38–126)
ALT: 253 U/L — AB (ref 14–54)
AST: 640 U/L — ABNORMAL HIGH (ref 15–41)
Albumin: 2.4 g/dL — ABNORMAL LOW (ref 3.5–5.0)
BILIRUBIN DIRECT: 0.1 mg/dL (ref 0.1–0.5)
Indirect Bilirubin: 0.6 mg/dL (ref 0.3–0.9)
TOTAL PROTEIN: 5.2 g/dL — AB (ref 6.5–8.1)
Total Bilirubin: 0.7 mg/dL (ref 0.3–1.2)

## 2015-10-31 LAB — CBC
HEMATOCRIT: 47.3 % — AB (ref 36.0–46.0)
HEMOGLOBIN: 16.2 g/dL — AB (ref 12.0–15.0)
MCH: 31.5 pg (ref 26.0–34.0)
MCHC: 34.2 g/dL (ref 30.0–36.0)
MCV: 92 fL (ref 78.0–100.0)
Platelets: 238 10*3/uL (ref 150–400)
RBC: 5.14 MIL/uL — AB (ref 3.87–5.11)
RDW: 13 % (ref 11.5–15.5)
WBC: 20.7 10*3/uL — AB (ref 4.0–10.5)

## 2015-10-31 LAB — LEGIONELLA PNEUMOPHILA SEROGP 1 UR AG: L. pneumophila Serogp 1 Ur Ag: NEGATIVE

## 2015-10-31 LAB — CK: Total CK: 29349 U/L — ABNORMAL HIGH (ref 38–234)

## 2015-10-31 LAB — MAGNESIUM
MAGNESIUM: 1.9 mg/dL (ref 1.7–2.4)
Magnesium: 1.8 mg/dL (ref 1.7–2.4)

## 2015-10-31 LAB — PHOSPHORUS
PHOSPHORUS: 2.2 mg/dL — AB (ref 2.5–4.6)
Phosphorus: 3.2 mg/dL (ref 2.5–4.6)

## 2015-10-31 LAB — PROCALCITONIN: PROCALCITONIN: 20.02 ng/mL

## 2015-10-31 MED ORDER — PROPOFOL 1000 MG/100ML IV EMUL
0.0000 ug/kg/min | INTRAVENOUS | Status: DC
  Filled 2015-10-31: qty 100

## 2015-10-31 MED ORDER — PROPOFOL 1000 MG/100ML IV EMUL
0.0000 ug/kg/min | INTRAVENOUS | Status: DC
  Administered 2015-10-31: 69.039 ug/kg/min via INTRAVENOUS
  Administered 2015-10-31 – 2015-11-01 (×2): 80 ug/kg/min via INTRAVENOUS
  Filled 2015-10-31 (×2): qty 100

## 2015-10-31 MED ORDER — VANCOMYCIN HCL IN DEXTROSE 750-5 MG/150ML-% IV SOLN
750.0000 mg | Freq: Three times a day (TID) | INTRAVENOUS | Status: DC
  Administered 2015-10-31 – 2015-11-01 (×3): 750 mg via INTRAVENOUS
  Filled 2015-10-31 (×5): qty 150

## 2015-10-31 NOTE — Progress Notes (Signed)
Pharmacy Antibiotic Note Traci [name] is a 30 y.o. female admitted on 10/30/2015 with respiratory failure and concern for  pneumonia.  Currently on day 2 of Zosyn and vancomycin.  SCr improved overnight 0.74 < 1.21, PCT 20, WBC 20.7 < 21.8  Plan: - With improvement in renal function will adjust vancomycin to 750 mg every 8 hours  - Continue Zosyn 3.375gm IV Q8H (4 hr inf) - F/u renal fxn, C&S, clinical status and trough at SS  Weight: 173 lb 8 oz (78.7 kg)  Temp (24hrs), Avg:100.5 F (38.1 C), Min:98.6 F (37 C), Max:102.1 F (38.9 C)   Recent Labs Lab 10/30/15 0953 10/30/15 1156 10/30/15 1212 10/31/15 0226  WBC 21.8*  --   --  20.7*  CREATININE 1.21*  --   --  0.74  LATICACIDVEN  --  1.9 1.92*  --     Estimated Creatinine Clearance: 105.3 mL/min (by C-G formula based on SCr of 0.8 mg/dL).    Allergies  Allergen Reactions  . Zinc Oxide Itching and Swelling    Antimicrobials this admission: Vanc 9/8>> Zosyn 9/8>>  Dose adjustments this admission: N/A  Microbiology results: 9/8: resp cx: px 9/8 BCx: ngtd   Thank you for allowing pharmacy to be a part of this patient's care.  Pollyann SamplesAndy Fleming Prill, PharmD, BCPS 10/31/2015, 11:53 AM Pager: [phone]
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
## 2015-10-31 NOTE — Progress Notes (Signed)
Patient's sister is good resource for info about patient history and such, FYI.   Candice [phone]
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
## 2015-11-01 ENCOUNTER — Inpatient Hospital Stay (HOSPITAL_COMMUNITY): Payer: Self-pay

## 2015-11-01 DIAGNOSIS — M6282 Rhabdomyolysis: Secondary | ICD-10-CM

## 2015-11-01 DIAGNOSIS — G9340 Encephalopathy, unspecified: Secondary | ICD-10-CM

## 2015-11-01 LAB — BASIC METABOLIC PANEL
Anion gap: 7 (ref 5–15)
BUN: 8 mg/dL (ref 6–20)
CHLORIDE: 107 mmol/L (ref 101–111)
CO2: 25 mmol/L (ref 22–32)
CREATININE: 0.53 mg/dL (ref 0.44–1.00)
Calcium: 8.2 mg/dL — ABNORMAL LOW (ref 8.9–10.3)
Glucose, Bld: 115 mg/dL — ABNORMAL HIGH (ref 65–99)
POTASSIUM: 4 mmol/L (ref 3.5–5.1)
SODIUM: 139 mmol/L (ref 135–145)

## 2015-11-01 LAB — GLUCOSE, CAPILLARY
GLUCOSE-CAPILLARY: 112 mg/dL — AB (ref 65–99)
GLUCOSE-CAPILLARY: 94 mg/dL (ref 65–99)
Glucose-Capillary: 144 mg/dL — ABNORMAL HIGH (ref 65–99)
Glucose-Capillary: 131 mg/dL — ABNORMAL HIGH (ref 65–99)
Glucose-Capillary: 82 mg/dL (ref 65–99)

## 2015-11-01 LAB — HEPATIC FUNCTION PANEL
ALBUMIN: 2.2 g/dL — AB (ref 3.5–5.0)
ALT: 189 U/L — ABNORMAL HIGH (ref 14–54)
AST: 403 U/L — ABNORMAL HIGH (ref 15–41)
Alkaline Phosphatase: 76 U/L (ref 38–126)
BILIRUBIN INDIRECT: 0.6 mg/dL (ref 0.3–0.9)
Bilirubin, Direct: 0.1 mg/dL (ref 0.1–0.5)
TOTAL PROTEIN: 5.1 g/dL — AB (ref 6.5–8.1)
Total Bilirubin: 0.7 mg/dL (ref 0.3–1.2)

## 2015-11-01 LAB — CK: Total CK: 13950 U/L — ABNORMAL HIGH (ref 38–234)

## 2015-11-01 LAB — PHOSPHORUS: Phosphorus: 2.3 mg/dL — ABNORMAL LOW (ref 2.5–4.6)

## 2015-11-01 LAB — CBC
HCT: 42.1 % (ref 36.0–46.0)
Hemoglobin: 13.7 g/dL (ref 12.0–15.0)
MCH: 30.5 pg (ref 26.0–34.0)
MCHC: 32.5 g/dL (ref 30.0–36.0)
MCV: 93.8 fL (ref 78.0–100.0)
PLATELETS: 211 10*3/uL (ref 150–400)
RBC: 4.49 MIL/uL (ref 3.87–5.11)
RDW: 13.4 % (ref 11.5–15.5)
WBC: 15.6 10*3/uL — AB (ref 4.0–10.5)

## 2015-11-01 LAB — PROTIME-INR
INR: 1.08
PROTHROMBIN TIME: 14.1 s (ref 11.4–15.2)

## 2015-11-01 LAB — APTT: APTT: 32 s (ref 24–36)

## 2015-11-01 LAB — PROCALCITONIN: PROCALCITONIN: 13.88 ng/mL

## 2015-11-01 LAB — MAGNESIUM: MAGNESIUM: 1.9 mg/dL (ref 1.7–2.4)

## 2015-11-01 MED ORDER — SODIUM CHLORIDE 0.9 % IV SOLN
INTRAVENOUS | Status: DC
  Administered 2015-11-01: 10:00:00 via INTRAVENOUS

## 2015-11-01 MED ORDER — FAMOTIDINE IN NACL 20-0.9 MG/50ML-% IV SOLN
20.0000 mg | Freq: Two times a day (BID) | INTRAVENOUS | Status: DC
  Administered 2015-11-01 – 2015-11-04 (×6): 20 mg via INTRAVENOUS
  Filled 2015-11-01 (×6): qty 50

## 2015-11-01 MED ORDER — SODIUM CHLORIDE 0.9 % IV SOLN
25.0000 ug/h | INTRAVENOUS | Status: DC
  Administered 2015-11-01: 25 ug/h via INTRAVENOUS
  Administered 2015-11-02: 100 ug/h via INTRAVENOUS
  Filled 2015-11-01 (×2): qty 50

## 2015-11-01 MED ORDER — SODIUM PHOSPHATES 45 MMOLE/15ML IV SOLN
10.0000 mmol | Freq: Once | INTRAVENOUS | Status: AC
  Administered 2015-11-01: 10 mmol via INTRAVENOUS
  Filled 2015-11-01: qty 3.33

## 2015-11-01 MED ORDER — PROPOFOL 1000 MG/100ML IV EMUL
0.0000 ug/kg/min | INTRAVENOUS | Status: DC
  Administered 2015-11-01 – 2015-11-02 (×11): 80 ug/kg/min via INTRAVENOUS
  Filled 2015-11-01 (×12): qty 100

## 2015-11-01 NOTE — Progress Notes (Signed)
Pt. Agitated on and off since morning assessment, with bouts of asynchrony and agitation. 3, 100 mcg fentanyl doses given, PRN qh2.  MD notified regarding inadequate sedation/pain management.  MD ordered IV fentanyl, titrated to desired RASS.  MD also informed of pt. Temp 101 F.  Only formulation for PRN acetaminophen is per tube.  Tube set to LIS, with consistent bilous output.  MD ordered to maintain LIS on NG tube and do not treat temperature and to notify MD if temp =>104 F.

## 2015-11-01 NOTE — Progress Notes (Signed)
11/01/15 0539  [facility] ADULT ICU REPLACEMENT PROTOCOL FOR AM LAB REPLACEMENT ONLY  The patient does apply for the [facility] Electrolyte Replacment Protocol based on the criteria listed below:   1. Is GFR >/= 40 ml/min? Yes.    Patient's GFR today is >60 2. Is urine output >/= 0.5 ml/kg/hr for the last 6 hours? Yes.   Patient's UOP is 2.3 ml/kg/hr 3. Is BUN < 60 mg/dL? Yes.    Patient's BUN today is 8 4. Abnormal electrolyte(s): Phos 2.3 5. Ordered repletion with: eLink Protocol  6. If a panic level lab has been reported, has the CCM MD in charge been notified? No..   Physician:  Larey DresserJennings   [name], Traci P 11/01/2015 5:39 AM

## 2015-11-01 NOTE — Progress Notes (Signed)
PULMONARY / CRITICAL CARE MEDICINE   Name: Traci [name] MRN: [id number] DOB: [birth year]    ADMISSION DATE:  10/30/2015   REFERRING MD: EDP  CHIEF COMPLAINT:  Pna resp failure  BRIEF: 30 y/o female admitted with acute encephalopathy and pneumonia requiring intubation in the setting of known polysubstance abuse.    SUBJECTIVE:  Vomited this morning, appears to have aspirated  VITAL SIGNS: BP (!) 164/117   Pulse (!) 111   Temp 99.4 F (37.4 C) (Axillary)   Resp (!) 35   Wt 176 lb 12.9 oz (80.2 kg)   LMP  (LMP Unknown)   SpO2 95%   BMI 30.35 kg/m   HEMODYNAMICS:    VENTILATOR SETTINGS: Vent Mode: PRVC FiO2 (%):  [40 %] 40 % Set Rate:  [22 bmp] 22 bmp Vt Set:  [500 mL] 500 mL PEEP:  [5 cmH20] 5 cmH20 Pressure Support:  [8 cmH20] 8 cmH20 Plateau Pressure:  [19 cmH20-23 cmH20] 23 cmH20  INTAKE / OUTPUT: I/O last 3 completed shifts: In: 7173.9 [I.V.:4273.9; NG/GT:1800; IV Piggyback:1100] Out: 4790 [Urine:4690; Emesis/NG output:100]  PHYSICAL EXAMINATION: General:  Sedated on vent HENT: NCAT ETT in place with dark secretions in tube PULM: Rhonchi bilaterally, vent supported breaths CV: RRR, no mgr GI: BS+, soft, nontender MSK: normal bulk and tone Neuro: sedated on vent, agitated intermittently  LABS:  BMET  Recent Labs Lab 10/30/15 0953 10/31/15 0226 11/01/15 0228  NA 135 136 139  K 4.6 4.0 4.0  CL 105 108 107  CO2 20* 20* 25  BUN 14 11 8   CREATININE 1.21* 0.74 0.53  GLUCOSE 158* 120* 115*    Electrolytes  Recent Labs Lab 10/30/15 0953  10/31/15 0226 10/31/15 1631 11/01/15 0228  CALCIUM 8.0*  --  7.7*  --  8.2*  MG  --   < > 1.8 1.9 1.9  PHOS  --   < > 3.2 2.2* 2.3*  < > = values in this interval not displayed.  CBC  Recent Labs Lab 10/30/15 0953 10/31/15 0226 11/01/15 0228  WBC 21.8* 20.7* 15.6*  HGB 15.8* 16.2* 13.7  HCT 45.5 47.3* 42.1  PLT 298 238 211    Coag's  Recent Labs Lab 10/30/15 1156 11/01/15 0228  APTT  --   32  INR 1.27 1.08    Sepsis Markers  Recent Labs Lab 10/30/15 1156 10/30/15 1212 10/31/15 0910 11/01/15 0228  LATICACIDVEN 1.9 1.92*  --   --   PROCALCITON  --   --  20.02 13.88    ABG  Recent Labs Lab 10/30/15 1139 10/31/15 0420  PHART 7.262* 7.423  PCO2ART 50.2* 34.0  PO2ART 178.0* 81.6*    Liver Enzymes  Recent Labs Lab 10/30/15 0953 10/31/15 0226 11/01/15 0228  AST 470* 640* 403*  ALT 180* 253* 189*  ALKPHOS 122 96 76  BILITOT 0.6 0.7 0.7  ALBUMIN 3.3* 2.4* 2.2*    Cardiac Enzymes No results for input(s): TROPONINI, PROBNP in the last 168 hours.  Glucose  Recent Labs Lab 10/31/15 1236 10/31/15 1555 10/31/15 1924 10/31/15 2322 11/01/15 0503 11/01/15 0854  GLUCAP 152* 116* 125* 118* 144* 131*    Imaging Dg Chest Port 1 View  Result Date: 11/01/2015 CLINICAL DATA:  Respiratory failure. EXAM: PORTABLE CHEST 1 VIEW COMPARISON:  10/31/2015 FINDINGS: 0604 hours. Low volume film. The cardio pericardial silhouette is enlarged. Retrocardiac left base collapse/ consolidation again noted. Endotracheal tube tip is 2.4 cm above the base of the carina. The NG tube passes into the stomach  although the distal tip position is not included on the film. Telemetry leads overlie the chest. IMPRESSION: Stable retrocardiac left base collapse/ consolidation. Electronically Signed   By: Kennith CenterEric  Mansell M.D.   On: 11/01/2015 08:13     STUDIES:  9/8 ct chest>> left lung consolidation 9/8 ct neck>> left clavicle area contusion   CULTURES: 9/8 bc x 2>> 9/8 uc>> 9/8 sputum>> multiple speces 9/8 urine legionella>>  ANTIBIOTICS: 9/8 vanc>> 9/10 9/8 zoysn>>  SIGNIFICANT EVENTS: 9/8 intubated in er 9/9 elevated CK 48K  LINES/TUBES: 9/8 ett>> 9/8 OGT>>  DISCUSSION: 30 yo with hx of substance abuse, suicide attempt in past, living in boarding hose and found in resp distress 9/8 and EMS was called. [facility] and was interactive but lethargic. GIVEN  NARCAN AND BECAME COMBATIVE. Unable to maintain O2 sats and she was intubated per EDP. Noted to have hemoptysis post intubation. Cxr reveals extensive opacities left lung. Noted to have vomitus on shirt by ems. She was notable for red raised area left neck and left buttock. Old slice marks were noted on left wrist. No needle marks were found.She will be admitted to ICU after CT neck/head, broad spectrum abx, MVS, pan culture and toxinology screen. 9/9 CxR improved. Suspect she laid on left side for an extended time.  9/10 vomited and aspirated  ASSESSMENT / PLAN:  PULMONARY A: Acute resp failure with hypoxia Aspiration of vomit witnessed 9/10 Presumed aspiration pna Hemoptysis post intubation P:   Vent bundle Abx Careful with any anticoagulation BD's as needed Pulm toilette Hold extubation given aspiration 9/10  CARDIOVASCULAR A:  No acute issues P:  Consider 2D pending culture review Fluids as needed Tele  RENAL  A:   AKI due to Rhabdo> improving P:   Continue IVF > change to NS Monitor BMET and UOP Replace electrolytes as needed   GASTROINTESTINAL A:   Vomiting> suspect due to gag reflex Shock liver, improving P:   OG tube to suction for 3-4 hours today, then change back to receive TF later this moring Continue famotidine for stress ulcer prophylaxis Repeat LFT  HEMATOLOGIC A:   No acute issues P:  DVT protection   INFECTIOUS A:   Aspiration pneumonia Suspected rt clavicular/neck cellulitis > Neck ct with left sternocleidomastoid muscle trauma vs cellulitis P:   F/u cultures Stop vancomycin Continue zosyn for aspiration pneumonia today, narrow 9/11 if stable  ENDOCRINE  A:   Hypoglycemia P:   Monitor glucose  NEUROLOGIC A:   Lethargic on admit but conversant and followed comands Combative with narcan injection Sedated on vent after intubation for hypoxia Hx of substance ab use P:   RASS goal: -1 Sedation protocol Thiamine and folic  acid   FAMILY  - Updates:  None bedside  - Inter-disciplinary family meet or Palliative Care meeting due by:  9/16  My cc time 35 minutes  Heber CarolinaBrent Kamrin Sibley, MD [facility] Pager: [phone] Cell: [phone] After 3pm or if no response, call [phone]

## 2015-11-02 ENCOUNTER — Inpatient Hospital Stay (HOSPITAL_COMMUNITY): Payer: Self-pay

## 2015-11-02 DIAGNOSIS — J9600 Acute respiratory failure, unspecified whether with hypoxia or hypercapnia: Secondary | ICD-10-CM

## 2015-11-02 LAB — CULTURE, RESPIRATORY W GRAM STAIN

## 2015-11-02 LAB — BASIC METABOLIC PANEL
Anion gap: 9 (ref 5–15)
BUN: 9 mg/dL (ref 6–20)
CALCIUM: 8.3 mg/dL — AB (ref 8.9–10.3)
CO2: 22 mmol/L (ref 22–32)
Chloride: 110 mmol/L (ref 101–111)
Creatinine, Ser: 0.62 mg/dL (ref 0.44–1.00)
GFR calc Af Amer: >60 mL/min (ref >60)
Glucose, Bld: 90 mg/dL (ref 65–99)
Potassium: 3.8 mmol/L (ref 3.5–5.1)
SODIUM: 141 mmol/L (ref 135–145)

## 2015-11-02 LAB — CBC WITH DIFFERENTIAL/PLATELET
BASOS ABS: 0 10*3/uL (ref 0.0–0.1)
BASOS PCT: 0 %
EOS PCT: 3 %
Eosinophils Absolute: 0.4 10*3/uL (ref 0.0–0.7)
HEMATOCRIT: 37.1 % (ref 36.0–46.0)
Hemoglobin: 12.1 g/dL (ref 12.0–15.0)
LYMPHS PCT: 20 %
Lymphs Abs: 2.6 10*3/uL (ref 0.7–4.0)
MCH: 30.6 pg (ref 26.0–34.0)
MCHC: 32.6 g/dL (ref 30.0–36.0)
MCV: 93.9 fL (ref 78.0–100.0)
MONO ABS: 0.8 10*3/uL (ref 0.1–1.0)
Monocytes Relative: 6 %
NEUTROS ABS: 9 10*3/uL — AB (ref 1.7–7.7)
Neutrophils Relative %: 71 %
PLATELETS: 199 10*3/uL (ref 150–400)
RBC: 3.95 MIL/uL (ref 3.87–5.11)
RDW: 13.5 % (ref 11.5–15.5)
WBC: 12.8 10*3/uL — AB (ref 4.0–10.5)

## 2015-11-02 LAB — GLUCOSE, CAPILLARY
GLUCOSE-CAPILLARY: 87 mg/dL (ref 65–99)
GLUCOSE-CAPILLARY: 89 mg/dL (ref 65–99)
GLUCOSE-CAPILLARY: 92 mg/dL (ref 65–99)
GLUCOSE-CAPILLARY: 95 mg/dL (ref 65–99)
GLUCOSE-CAPILLARY: 96 mg/dL (ref 65–99)
Glucose-Capillary: 82 mg/dL (ref 65–99)

## 2015-11-02 LAB — CULTURE, RESPIRATORY

## 2015-11-02 LAB — PHOSPHORUS: PHOSPHORUS: 3.6 mg/dL (ref 2.5–4.6)

## 2015-11-02 LAB — PROCALCITONIN: PROCALCITONIN: 6.53 ng/mL

## 2015-11-02 LAB — TRIGLYCERIDES
TRIGLYCERIDES: 280 mg/dL — AB (ref <150)
Triglycerides: 332 mg/dL — ABNORMAL HIGH (ref <150)

## 2015-11-02 MED ORDER — FENTANYL CITRATE (PF) 100 MCG/2ML IJ SOLN
50.0000 ug | INTRAMUSCULAR | Status: DC | PRN
  Administered 2015-11-02 – 2015-11-03 (×6): 50 ug via INTRAVENOUS
  Filled 2015-11-02 (×7): qty 2

## 2015-11-02 MED ORDER — SODIUM CHLORIDE 0.45 % IV SOLN
INTRAVENOUS | Status: DC
  Administered 2015-11-02: 12:00:00 via INTRAVENOUS
  Administered 2015-11-03: 100 mL/h via INTRAVENOUS
  Administered 2015-11-04 – 2015-11-07 (×4): via INTRAVENOUS

## 2015-11-02 MED ORDER — FENTANYL 25 MCG/HR TD PT72
50.0000 ug | MEDICATED_PATCH | TRANSDERMAL | Status: DC
Start: 2015-11-02 — End: 2015-11-03
  Administered 2015-11-02: 50 ug via TRANSDERMAL
  Filled 2015-11-02: qty 2

## 2015-11-02 MED ORDER — CEFAZOLIN SODIUM-DEXTROSE 2-4 GM/100ML-% IV SOLN
2.0000 g | Freq: Three times a day (TID) | INTRAVENOUS | Status: DC
  Administered 2015-11-02 – 2015-11-07 (×16): 2 g via INTRAVENOUS
  Filled 2015-11-02 (×20): qty 100

## 2015-11-02 NOTE — Progress Notes (Signed)
PULMONARY / CRITICAL CARE MEDICINE   Name: Traci [name] MRN: [id number] DOB: [birth year]    ADMISSION DATE:  10/30/2015   REFERRING MD: EDP  CHIEF COMPLAINT:  Pna resp failure  BRIEF: 30 y/o female admitted with acute encephalopathy and pneumonia requiring intubation in the setting of known polysubstance abuse.   SUBJECTIVE:  Agitated on vent yesterday, apparently aspirated. NGT with high output.   VITAL SIGNS: BP 102/68   Pulse 74   Temp 99.8 F (37.7 C) (Oral)   Resp (!) 22   Wt 175 lb 14.8 oz (79.8 kg)   LMP  (LMP Unknown)   SpO2 98%   BMI 30.20 kg/m   HEMODYNAMICS:    VENTILATOR SETTINGS: Vent Mode: PRVC FiO2 (%):  [40 %] 40 % Set Rate:  [22 bmp] 22 bmp Vt Set:  [500 mL] 500 mL PEEP:  [5 cmH20] 5 cmH20 Plateau Pressure:  [19 cmH20-21 cmH20] 21 cmH20  INTAKE / OUTPUT: I/O last 3 completed shifts: In: 5948.5 [I.V.:4288.5; NG/GT:860; IV Piggyback:800] Out: 4945 [Urine:3395; Emesis/NG output:1550]  PHYSICAL EXAMINATION: General:  Sedated on vent, follows commands  HENT: NCAT ETT in place with dark secretions in tube PULM: Rhonchi bilaterally CV: RRR, no m/g/r GI: BS+, soft, nontender MSK: normal bulk and tone Neuro: sedated on vent, agitated intermittently  LABS:  BMET  Recent Labs Lab 10/31/15 0226 11/01/15 0228 11/02/15 0309  NA 136 139 141  K 4.0 4.0 3.8  CL 108 107 110  CO2 20* 25 22  BUN 11 8 9   CREATININE 0.74 0.53 0.62  GLUCOSE 120* 115* 90    Electrolytes  Recent Labs Lab 10/31/15 0226 10/31/15 1631 11/01/15 0228 11/02/15 0309  CALCIUM 7.7*  --  8.2* 8.3*  MG 1.8 1.9 1.9  --   PHOS 3.2 2.2* 2.3* 3.6    CBC  Recent Labs Lab 10/31/15 0226 11/01/15 0228 11/02/15 0309  WBC 20.7* 15.6* 12.8*  HGB 16.2* 13.7 12.1  HCT 47.3* 42.1 37.1  PLT 238 211 199    Coag's  Recent Labs Lab 10/30/15 1156 11/01/15 0228  APTT  --  32  INR 1.27 1.08    Sepsis Markers  Recent Labs Lab 10/30/15 1156 10/30/15 1212  10/31/15 0910 11/01/15 0228 11/02/15 0309  LATICACIDVEN 1.9 1.92*  --   --   --   PROCALCITON  --   --  20.02 13.88 6.53    ABG  Recent Labs Lab 10/30/15 1139 10/31/15 0420  PHART 7.262* 7.423  PCO2ART 50.2* 34.0  PO2ART 178.0* 81.6*    Liver Enzymes  Recent Labs Lab 10/30/15 0953 10/31/15 0226 11/01/15 0228  AST 470* 640* 403*  ALT 180* 253* 189*  ALKPHOS 122 96 76  BILITOT 0.6 0.7 0.7  ALBUMIN 3.3* 2.4* 2.2*    Cardiac Enzymes No results for input(s): TROPONINI, PROBNP in the last 168 hours.  Glucose  Recent Labs Lab 11/01/15 1253 11/01/15 1615 11/01/15 2030 11/02/15 0014 11/02/15 0417 11/02/15 0753  GLUCAP 112* 94 82 87 89 96    Imaging Dg Chest Port 1 View  Result Date: 11/02/2015 CLINICAL DATA:  Respiratory failure with hypoxia. Endotracheal support. EXAM: PORTABLE CHEST 1 VIEW COMPARISON:  11/01/2015 FINDINGS: Endotracheal tube tip is 3 cm above the carina. Nasogastric tube enters stomach. Right lung is clear. Left lower lobe collapse/ pneumonia persists. No new finding. IMPRESSION: Persistent left lower lobe collapse/ pneumonia. Electronically Signed   By: Paulina Fusi M.D.   On: 11/02/2015 07:46     STUDIES:  9/8 ct chest>> left lung consolidation 9/8 ct neck>> left clavicle area contusion   CULTURES: 9/8 bc x 2>> 9/8 uc>> 9/8 sputum>> multiple species, staph aureus 9/8 urine legionella>> neg  ANTIBIOTICS: 9/8 vanc>> 9/10 9/8 zoysn>>  SIGNIFICANT EVENTS: 9/8 intubated in er 9/9 elevated CK 48K  LINES/TUBES: 9/8 ett>> 9/8 OGT>>  DISCUSSION: 30 yo with hx of substance abuse, suicide attempt in past, living in boarding hose and found in resp distress 9/8 and EMS was called. [facility] and was interactive but lethargic. GIVEN NARCAN AND BECAME COMBATIVE. Unable to maintain O2 sats and she was intubated per EDP. Noted to have hemoptysis post intubation. Cxr reveals extensive opacities left lung. Noted to have vomitus on  shirt by ems. She was notable for red raised area left neck and left buttock. Old slice marks were noted on left wrist. No needle marks were found.She will be admitted to ICU after CT neck/head, broad spectrum abx, MVS, pan culture and toxinology screen. 9/9 CxR improved. Suspect she laid on left side for an extended time.  9/10 vomited and aspirated  ASSESSMENT / PLAN:  PULMONARY A: Acute resp failure with hypoxia Aspiration of vomit witnessed 9/10 Presumed aspiration pna Hemoptysis post intubation P:   Vent bundle Abx Careful with any anticoagulation BD's as needed Pulm toilette Hold extubation given aspiration 9/10 Weaning this am as able  CARDIOVASCULAR A:  No acute issues P:  Fluids as needed Tele  RENAL  A:   AKI due to Rhabdo> improved hyperchloremia P:   Continue IVF Monitor BMET and UOP Replace electrolytes as needed Avoid saline  GASTROINTESTINAL A:   Vomiting> suspect due to gag reflex Shock liver, improving P:   OG tube suction Continue famotidine for stress ulcer prophylaxis Repeat LFTs tomorrow   HEMATOLOGIC A:   No acute issues P:  DVT protection   INFECTIOUS A:   Aspiration pneumonia Suspected rt clavicular/neck cellulitis > Neck ct with left sternocleidomastoid muscle trauma vs cellulitis P:   F/u cultures Continue zosyn for aspiration pneumonia today, narrow 9/11 if stable Restart Vanc - dc  ENDOCRINE  A:   Hypoglycemia P:   Monitor glucose  NEUROLOGIC A:   Lethargic on admit but conversant and followed comands Combative with narcan injection Sedated on vent after intubation for hypoxia Hx of substance ab use P:   RASS goal: -1 Sedation protocol Thiamine and folic acid   FAMILY  - Updates:  None bedside  - Inter-disciplinary family meet or Palliative Care meeting due by:  9/16  Rich Numberarly Rivet, MD, MPH Internal Medicine Resident, PGY-III Pager: [phone]  STAFF NOTE: Cindi CarbonI, Daniel Feinstein, MD FACP have personally  reviewed patient's available data, including medical history, events of note, physical examination and test results as part of my evaluation. I have discussed with resident/NP and other care providers such as pharmacist, RN and RRT. In addition, I personally evaluated patient and elicited key findings of: awake, agitaed, pcxr reassuring, on min O2 support, wean as able, cpap 5 ps5, goal 30 min, WUA as able, okay to keep some fent drip for extubation even if needed, will need psych and suicide watch to remain, dc prop to allow sbt, staph noted, pct down change zosyn to ancef consider 8 days (pcxr neg), LFT and rhabdo improving, cl rise, dc saline, add 1/2 NS, will follow chem The patient is critically ill with multiple organ systems failure and requires high complexity decision making for assessment and support, frequent evaluation and titration of therapies,  application of advanced monitoring technologies and extensive interpretation of multiple databases.   Critical Care Time devoted to patient care services described in this note is 30 Minutes. This time reflects time of care of this signee: Rory Percy, MD FACP. This critical care time does not reflect procedure time, or teaching time or supervisory time of PA/NP/Med student/Med Resident etc but could involve care discussion time. Rest per NP/medical resident whose note is outlined above and that I agree with   Mcarthur Rossetti. Tyson Alias, MD, FACP Pgr: [phone] [facility] 11/02/2015 10:33 AM

## 2015-11-02 NOTE — Progress Notes (Signed)
175 mL Fentanyl WIS with Andreas Bloweraroline Fowler, RN.

## 2015-11-02 NOTE — Procedures (Signed)
Extubation Procedure Note  Patient Details:   Name: Traci [name] DOB: 08/20/1985 MRN: [id number]   Airway Documentation:     Evaluation  O2 sats: stable throughout Complications: No apparent complications Patient did tolerate procedure well. Bilateral Breath Sounds: Rhonchi, Diminished   Yes   Pt extubated to 3L [place] per MD order. Pt able to speak and has a strong productive cough post extubation. Pt encouraged to use Y[place]auer to clear copious secretions. RT will continue to closely monitor.   Carolan ShiverKelley, Tondalaya Perren M 11/02/2015, 12:08 PM

## 2015-11-02 NOTE — Progress Notes (Signed)
Pharmacy Antibiotic Note Traci DieterHannah [name] is a 30 y.o. female admitted on 10/30/2015 with respiratory failure and concern for  pneumonia.  Currently on day 3 antibiotics. Pharmacy consulted to narrow to cefazolin for MSSA pneumonia.  AKI resolved, SCr back down to normal. WBC trending down.  Plan: - Cefazolin 2 g IV q8h - Plan is for 8 days of therapy - stop date after 9/16 doses complete - Pharmacy signing off, please re-consult if needed  Weight: 175 lb 14.8 oz (79.8 kg)  Temp (24hrs), Avg:99.7 F (37.6 C), Min:98.6 F (37 C), Max:101 F (38.3 C)   Recent Labs Lab 10/30/15 0953 10/30/15 1156 10/30/15 1212 10/31/15 0226 11/01/15 0228 11/02/15 0309  WBC 21.8*  --   --  20.7* 15.6* 12.8*  CREATININE 1.21*  --   --  0.74 0.53 0.62  LATICACIDVEN  --  1.9 1.92*  --   --   --     Estimated Creatinine Clearance: 106 mL/min (by C-G formula based on SCr of 0.8 mg/dL).    Allergies  Allergen Reactions  . Zinc Oxide Itching and Swelling    Antimicrobials this admission: Vanc 9/8>>9/10 Zosyn 9/8>>9/11  Dose adjustments this admission: N/A  Microbiology results: 9/8: resp cx: MSSA 9/8 BCx: ngtd   Thank you for allowing pharmacy to be a part of this patient's care.  Loura BackJennifer , PharmD, BCPS Clinical Pharmacist Phone for today [phone] Main pharmacy - [phone] 11/02/2015 11:06 AM

## 2015-11-02 NOTE — Progress Notes (Signed)
Spoke with patient about her emotional/mental state. Patient states the event bringing her to the hospital was strictly an overdose. It had nothing to do with suicide. Patient states she doesn't want to harm herself and she feels safe at home. "I want this to be over, but I don't want to end my life." Will continue to monitor closely.

## 2015-11-03 DIAGNOSIS — R401 Stupor: Secondary | ICD-10-CM

## 2015-11-03 DIAGNOSIS — F1124 Opioid dependence with opioid-induced mood disorder: Secondary | ICD-10-CM

## 2015-11-03 LAB — COMPREHENSIVE METABOLIC PANEL
ALBUMIN: 2.7 g/dL — AB (ref 3.5–5.0)
ALT: 197 U/L — AB (ref 14–54)
AST: 399 U/L — AB (ref 15–41)
Alkaline Phosphatase: 67 U/L (ref 38–126)
Anion gap: 6 (ref 5–15)
BILIRUBIN TOTAL: 1.1 mg/dL (ref 0.3–1.2)
BUN: 11 mg/dL (ref 6–20)
CHLORIDE: 111 mmol/L (ref 101–111)
CO2: 23 mmol/L (ref 22–32)
CREATININE: 0.57 mg/dL (ref 0.44–1.00)
Calcium: 8.6 mg/dL — ABNORMAL LOW (ref 8.9–10.3)
GFR calc Af Amer: >60 mL/min (ref >60)
GLUCOSE: 90 mg/dL (ref 65–99)
Potassium: 3.5 mmol/L (ref 3.5–5.1)
Sodium: 140 mmol/L (ref 135–145)
Total Protein: 5.6 g/dL — ABNORMAL LOW (ref 6.5–8.1)

## 2015-11-03 LAB — GLUCOSE, CAPILLARY
GLUCOSE-CAPILLARY: 82 mg/dL (ref 65–99)
GLUCOSE-CAPILLARY: 85 mg/dL (ref 65–99)
GLUCOSE-CAPILLARY: 98 mg/dL (ref 65–99)
Glucose-Capillary: 111 mg/dL — ABNORMAL HIGH (ref 65–99)
Glucose-Capillary: 125 mg/dL — ABNORMAL HIGH (ref 65–99)
Glucose-Capillary: 133 mg/dL — ABNORMAL HIGH (ref 65–99)
Glucose-Capillary: 75 mg/dL (ref 65–99)

## 2015-11-03 MED ORDER — POTASSIUM CHLORIDE CRYS ER 20 MEQ PO TBCR
20.0000 meq | EXTENDED_RELEASE_TABLET | Freq: Two times a day (BID) | ORAL | Status: AC
  Administered 2015-11-03 – 2015-11-04 (×3): 20 meq via ORAL
  Filled 2015-11-03 (×3): qty 1

## 2015-11-03 MED ORDER — HYDROMORPHONE HCL 1 MG/ML IJ SOLN
1.0000 mg | INTRAMUSCULAR | Status: DC | PRN
  Administered 2015-11-03 – 2015-11-04 (×10): 1 mg via INTRAVENOUS
  Filled 2015-11-03 (×10): qty 1

## 2015-11-03 MED ORDER — BOOST / RESOURCE BREEZE PO LIQD
1.0000 | Freq: Three times a day (TID) | ORAL | Status: DC
  Administered 2015-11-03 – 2015-11-06 (×8): 1 via ORAL
  Filled 2015-11-03 (×3): qty 1

## 2015-11-03 MED ORDER — POTASSIUM CHLORIDE 20 MEQ PO PACK: 20.0000 meq | PACK | Freq: Two times a day (BID) | ORAL | Status: DC

## 2015-11-03 MED ORDER — CITALOPRAM HYDROBROMIDE 20 MG PO TABS
20.0000 mg | ORAL_TABLET | Freq: Every day | ORAL | Status: DC
  Administered 2015-11-03 – 2015-11-07 (×5): 20 mg via ORAL
  Filled 2015-11-03 (×5): qty 1

## 2015-11-03 MED ORDER — FOLIC ACID 1 MG PO TABS
1.0000 mg | ORAL_TABLET | Freq: Every day | ORAL | Status: DC
  Administered 2015-11-04 – 2015-11-07 (×4): 1 mg via ORAL
  Filled 2015-11-03 (×4): qty 1

## 2015-11-03 MED ORDER — VITAMIN B-1 100 MG PO TABS
100.0000 mg | ORAL_TABLET | Freq: Every day | ORAL | Status: DC
  Administered 2015-11-04 – 2015-11-07 (×4): 100 mg via ORAL
  Filled 2015-11-03 (×4): qty 1

## 2015-11-03 NOTE — Progress Notes (Signed)
Nutrition Follow Up  DOCUMENTATION CODES:   Not applicable  INTERVENTION:    Boost Breeze po TID, each supplement provides 250 kcal and 9 grams of protein  NEW NUTRITION DIAGNOSIS:   Increased nutrient needs related to acute illness as evidenced by estimated needs, ongoing  GOAL:   Patient will meet greater than or equal to 90% of their needs, progressing   MONITOR:   PO intake, Supplement acceptance, Labs, Weight trends, I & O's  ASSESSMENT:   30 yo with hx of substance abuse, suicide attempt in past, living in boarding hose and found in resp distress 9/8 and EMS was called. [facility] and was interactive but lethargic. GIVEN NARCAN AND BECAME COMBATIVE. Unable to maintain O2 sats and she was intubated per EDP. Noted to have hemoptysis post intubation. Cxr reveals extensive opacities left lung. Noted to have vomitus on shirt by ems. She was notable for red raised area left neck and left buttock.   Pt extubated 9/11. TF regimen (Vital High Protein formula & Prostat liquid protein) via NGT discontinued with extubation. Chart reviewed >> pt here for heroin overdose; recent relapse. Advanced to Clear Liquids this AM. Receiving IV folic acid & thiamine daily. Would benefit from addition of oral nutrition supplements >> will order Boost Breeze TID.  Diet Order:  Diet clear liquid Room service appropriate? Yes; Fluid consistency: Thin  Skin:  Reviewed, no issues  Last BM:  9/11  Height:   Ht Readings from Last 1 Encounters:  08/03/15 5\' 4"  (1.626 m)    Weight:   Wt Readings from Last 1 Encounters:  11/03/15 170 lb 6.7 oz (77.3 kg)    Ideal Body Weight:  54.54 kg  BMI:  Body mass index is 29.25 kg/m.  Estimated Nutritional Needs:   Kcal:  1900-2100  Protein:  110-120 gm  Fluid:  1.9-2.1 L  EDUCATION NEEDS:   No education needs identified at this time  Maureen ChattersKatie Diandre Merica, RD, LDN Pager #: [phone] After-Hours Pager #: [phone]

## 2015-11-03 NOTE — Progress Notes (Signed)
PULMONARY / CRITICAL CARE MEDICINE   Name: Traci DieterHannah [name] MRN: [id number] DOB: 08/02/1985    ADMISSION DATE:  10/30/2015   REFERRING MD: EDP  CHIEF COMPLAINT:  Pna resp failure  BRIEF: 30 y/o female admitted with acute encephalopathy and pneumonia requiring intubation in the setting of known polysubstance abuse.   SUBJECTIVE:  Extubated yesterday. Calm today. States she knows she is here for heroin overdose. Reports it was unintentional. She snorts heroin, does not inject. She states she had been clean for 2 years, but recently relapsed.   VITAL SIGNS: BP 117/76 (BP Location: Right Arm)   Pulse 77   Temp 99.4 F (37.4 C) (Oral)   Resp (!) 25   Wt 170 lb 6.7 oz (77.3 kg)   LMP  (LMP Unknown)   SpO2 95%   BMI 29.25 kg/m   HEMODYNAMICS:    VENTILATOR SETTINGS:    INTAKE / OUTPUT: I/O last 3 completed shifts: In: 4238.5 [I.V.:3588.5; NG/GT:150; IV Piggyback:500] Out: 3680 [Urine:1330; Emesis/NG output:2350]  PHYSICAL EXAMINATION: General: Young woman sitting up in bed, NAD HENT: [place]/AT, EOMI, sclera anicteric, mucus membranes moist PULM: CTA bilaterally, breaths non-labored CV: RRR, no m/g/r GI: BS+, soft, nontender MSK: normal bulk and tone, moves all extremities  Neuro: alert and oriented x 3, no focal deficits   LABS:  BMET  Recent Labs Lab 10/31/15 0226 11/01/15 0228 11/02/15 0309  NA 136 139 141  K 4.0 4.0 3.8  CL 108 107 110  CO2 20* 25 22  BUN 11 8 9   CREATININE 0.74 0.53 0.62  GLUCOSE 120* 115* 90    Electrolytes  Recent Labs Lab 10/31/15 0226 10/31/15 1631 11/01/15 0228 11/02/15 0309  CALCIUM 7.7*  --  8.2* 8.3*  MG 1.8 1.9 1.9  --   PHOS 3.2 2.2* 2.3* 3.6    CBC  Recent Labs Lab 10/31/15 0226 11/01/15 0228 11/02/15 0309  WBC 20.7* 15.6* 12.8*  HGB 16.2* 13.7 12.1  HCT 47.3* 42.1 37.1  PLT 238 211 199    Coag's  Recent Labs Lab 10/30/15 1156 11/01/15 0228  APTT  --  32  INR 1.27 1.08    Sepsis Markers  Recent  Labs Lab 10/30/15 1156 10/30/15 1212 10/31/15 0910 11/01/15 0228 11/02/15 0309  LATICACIDVEN 1.9 1.92*  --   --   --   PROCALCITON  --   --  20.02 13.88 6.53    ABG  Recent Labs Lab 10/30/15 1139 10/31/15 0420  PHART 7.262* 7.423  PCO2ART 50.2* 34.0  PO2ART 178.0* 81.6*    Liver Enzymes  Recent Labs Lab 10/30/15 0953 10/31/15 0226 11/01/15 0228  AST 470* 640* 403*  ALT 180* 253* 189*  ALKPHOS 122 96 76  BILITOT 0.6 0.7 0.7  ALBUMIN 3.3* 2.4* 2.2*    Cardiac Enzymes No results for input(s): TROPONINI, PROBNP in the last 168 hours.  Glucose  Recent Labs Lab 11/02/15 1119 11/02/15 1534 11/02/15 2018 11/03/15 0012 11/03/15 0441 11/03/15 0814  GLUCAP 92 95 82 82 85 75    Imaging No results found.   STUDIES:  9/8 ct chest>> left lung consolidation 9/8 ct neck>> left clavicle area contusion   CULTURES: 9/8 bc x 2>> 9/8 sputum>> moderate staph aureus 9/8 urine legionella>> neg  ANTIBIOTICS: 9/8 vanc>> 9/10 9/8 zoysn>>9/11 9/11 Ancef>>consider 8 days total  SIGNIFICANT EVENTS: 9/8 intubated in er 9/9 elevated CK 48K 9/11- extubated  LINES/TUBES: 9/8 ett>>9/11 9/8 OGT>>  DISCUSSION: 30 yo with hx of substance abuse, suicide attempt in  past, living in boarding hose and found in resp distress 9/8 and EMS was called. Transported to [facility] ED and was interactive but lethargic. GIVEN NARCAN AND BECAME COMBATIVE. Unable to maintain O2 sats and she was intubated per EDP. Noted to have hemoptysis post intubation. Cxr reveals extensive opacities left lung. Noted to have vomitus on shirt by ems. She was notable for red raised area left neck and left buttock. Old slice marks were noted on left wrist. No needle marks were found.She will be admitted to ICU after CT neck/head, broad spectrum abx, MVS, pan culture and toxinology screen. 9/9 CxR improved. Suspect she laid on left side for an extended time.  9/10 vomited and aspirated  ASSESSMENT /  PLAN:  PULMONARY A: Acute resp failure with hypoxia Aspiration of vomit witnessed 9/10 Presumed aspiration pna Hemoptysis post intubation P:   Antibiotics  IS  CARDIOVASCULAR A:  No acute issues P:  Fluids as needed Tele  RENAL  A:   AKI due to Rhabdo> improved hyperchloremia P:   Continue IVF to 1/2 NS Monitor BMET and UOP  GASTROINTESTINAL A:   Vomiting> suspect due to gag reflex Shock liver, improving P:   OG tube suction Stop stress ulcer prophylaxis F/u repeat LFTs Start diet  HEMATOLOGIC A:   No acute issues P:  DVT protection   INFECTIOUS A:   Aspiration pneumonia Suspected rt clavicular/neck cellulitis > Neck ct with left sternocleidomastoid muscle trauma vs cellulitis P:   F/u cultures Continue Ancef (stop date 9/16)  ENDOCRINE  A:   Hypoglycemia P:   Monitor glucose  NEUROLOGIC A:   Lethargic on admit but conversant and followed comands Combative with narcan injection Sedated on vent after intubation for hypoxia Hx of substance ab use P:   Psych eval Suicide precautions Restart Celexa PT eval Thiamine and folic acid   FAMILY  - Updates:  None bedside  - Inter-disciplinary family meet or Palliative Care meeting due by:  9/16  Rich Number, MD, MPH Internal Medicine Resident, PGY-III Pager: [phone]   STAFF NOTE: Cindi Carbon, MD FACP have personally reviewed patient's available data, including medical history, events of note, physical examination and test results as part of my evaluation. I have discussed with resident/NP and other care providers such as pharmacist, RN and RRT. In addition, I personally evaluated patient and elicited key findings of: awake, nonfocal, no distress, lungs clear, will treat for asp staph PNA, stop date in place, output somewhat of concern, she is hungry, abdo soft, KUB x 1, clamp NGT likley, some concerns narcotic induced ileus, may be NGt is post pyloric, get psych, move to floor, fent  patch dc'ed, fent int prn, dilaudid started, which indicastes likley regular use , may need methadone  Mcarthur Rossetti. Tyson Alias, MD, FACP Pgr: [phone] [facility] 11/03/2015 10:30 AM

## 2015-11-03 NOTE — Progress Notes (Signed)
Patient becoming increasingly agitated and complaining of generalized pain every hour despite fentanyl pushes IV. MD Sood notified and received orders for dilaudid. Will continue to monitor patient.   Horton ChinMacKayla A Demi Trieu, RN

## 2015-11-03 NOTE — Progress Notes (Signed)
eLink Physician-Brief Progress Note Patient Name: Traci [name] DOB: 11/24/1985 MRN: [id number]   Date of Service  11/03/2015  HPI/Events of Note  Persistent symptoms of pain.  Not improved with q1h fentanyl.   eICU Interventions  Will add prn dilaudid.      Intervention Category Major Interventions: Other:  Evian Derringer 11/03/2015, 2:51 AM

## 2015-11-03 NOTE — Consult Note (Signed)
[facility] Consult   Reason for Consult:  Depression, substance abuse  Referring Physician: dr. Halford Chessman  Patient Identification: Traci [name] MRN:  [id number] Principal Diagnosis:  Opiate Overdose  Diagnosis:   Patient Active Problem List   Diagnosis Date Noted  . Altered mental state [R41.82] 10/30/2015  . Sepsis ([place]) [A41.9]   . Aspiration pneumonia of left lower lobe due to gastric secretions (HCC) [J69.0]   . Acute respiratory failure ([place]) [J96.00]   . AKI (acute kidney injury) ([place]) [N17.9]   . Cellulitis, neck [L03.221]   . Opioid dependence with withdrawal ([place]) [F11.23]   . Dysthymia [F34.1]   . 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 Moderate episode of recurrent major depressive disorder ([place]) [F33.1]   . Substance induced mood disorder ([place]) [F19.94] 05/30/2015  . Major depression, recurrent ([place]) [F33.9] 05/30/2015  . Polysubstance abuse [F19.10] 05/30/2015    Total Time spent with patient: 30 minutes  Subjective:   Traci [name] is a 30 y.o. female patient admitted with  respiratory distress .  HPI:  30 year old female, history of substance dependence- states that she had relapsed on heroin a few weeks ago, after a period of sobriety. To a lesser degree, was also abusing BZDs , depending on availability . She overdosed on heroin, which she states was accidental and definitely not a suicide attempt . She states " I was getting high, I did not want to die ". She responded to Narcan, became combative, but required intubation as 02 sats were falling . At this time extubated, breathing comfortably at room air, and in no acute distress . She reports some depression , which is chronic, and which tends to worsen with active drug use . She states she had superficially cut on her wrist ( no significant bleeding, no sutures ) about two weeks ago " not as a suicide attempt but to deal with my feelings " . Patient denies any suicidal ideations at this time, and  is future oriented- she wants to return to her living situation, wants to return to work soon, and states she is motivated in sobriety- plans to reconnect with her sober friends, with NA , and with her sponsor. States she is considering starting either methadone or suboxone management after discharge , to help her maintain abstinence from heroin .  Past Psychiatric History: history of depression, history of self cutting, history of substance dependence ( opiates are substance of choice ) . She was hospitalized at [facility] in June 2017 for opiate dependence and depression. At that time was discharged on Celexa, Vistaril, Neurontin . Patient states she does feel medications were at least partially helpful and denies side effects   Risk to Self: Is patient at risk for suicide?: No Risk to Others:   Prior Inpatient Therapy:   Prior Outpatient Therapy:    Past Medical History:  Past Medical History:  Diagnosis Date  . ADD (attention deficit disorder)   . Depression   . Substance abuse    History reviewed. No pertinent surgical history. Family History: No family history on file. Family Psychiatric  History: non contributory  Social History:  Lives with sober roommates, employed. Has one daughter, aged 11, who is with father. Not seeing her daughter often is a chronic stressor History  Alcohol Use No     History  Drug Use  . Types: Heroin    Social History   Social History  . Marital status: Single    Spouse name: N/A  .  Number of children: N/A  . Years of education: N/A   Social History Main Topics  . Smoking status: Current Every Day Smoker    Packs/day: 1.00    Years: 10.00    Types: Cigarettes  . Smokeless tobacco: None  . Alcohol use No  . Drug use:     Types: Heroin  . Sexual activity: Not Currently    Birth control/ protection: IUD   Other Topics Concern  . None   Social History Narrative  . None   Additional Social History:    Allergies:   Allergies  Allergen  Reactions  . Zinc Oxide Itching and Swelling    Labs:  Results for orders placed or performed during the hospital encounter of 10/30/15 (from the past 48 hour(s))  Glucose, capillary     Status: Abnormal   Collection Time: 11/01/15 12:53 PM  Result Value Ref Range   Glucose-Capillary 112 (H) 65 - 99 mg/dL   Comment 1 Capillary Specimen    Comment 2 Notify RN   Glucose, capillary     Status: None   Collection Time: 11/01/15  4:15 PM  Result Value Ref Range   Glucose-Capillary 94 65 - 99 mg/dL   Comment 1 Capillary Specimen    Comment 2 Notify RN   Glucose, capillary     Status: None   Collection Time: 11/01/15  8:30 PM  Result Value Ref Range   Glucose-Capillary 82 65 - 99 mg/dL   Comment 1 Capillary Specimen    Comment 2 Notify RN    Comment 3 Document in Chart   Glucose, capillary     Status: None   Collection Time: 11/02/15 12:14 AM  Result Value Ref Range   Glucose-Capillary 87 65 - 99 mg/dL   Comment 1 Capillary Specimen    Comment 2 Notify RN    Comment 3 Document in Chart   Procalcitonin     Status: None   Collection Time: 11/02/15  3:09 AM  Result Value Ref Range   Procalcitonin 6.53 ng/mL    Comment:        Interpretation: PCT > 2 ng/mL: Systemic infection (sepsis) is likely, unless other causes are known. (NOTE)         ICU PCT Algorithm               Non ICU PCT Algorithm    ----------------------------     ------------------------------         PCT < 0.25 ng/mL                 PCT < 0.1 ng/mL     Stopping of antibiotics            Stopping of antibiotics       strongly encouraged.               strongly encouraged.    ----------------------------     ------------------------------       PCT level decrease by               PCT < 0.25 ng/mL       >= 80% from peak PCT       OR PCT 0.25 - 0.5 ng/mL          Stopping of antibiotics  encouraged.     Stopping of antibiotics           encouraged.     ----------------------------     ------------------------------       PCT level decrease by              PCT >= 0.25 ng/mL       < 80% from peak PCT        AND PCT >= 0.5 ng/mL            Continuing antibiotics                                               encouraged.       Continuing antibiotics            encouraged.    ----------------------------     ------------------------------     PCT level increase compared          PCT > 0.5 ng/mL         with peak PCT AND          PCT >= 0.5 ng/mL             Escalation of antibiotics                                          strongly encouraged.      Escalation of antibiotics        strongly encouraged.   Phosphorus in AM     Status: None   Collection Time: 11/02/15  3:09 AM  Result Value Ref Range   Phosphorus 3.6 2.5 - 4.6 mg/dL  Basic metabolic panel     Status: Abnormal   Collection Time: 11/02/15  3:09 AM  Result Value Ref Range   Sodium 141 135 - 145 mmol/L   Potassium 3.8 3.5 - 5.1 mmol/L   Chloride 110 101 - 111 mmol/L   CO2 22 22 - 32 mmol/L   Glucose, Bld 90 65 - 99 mg/dL   BUN 9 6 - 20 mg/dL   Creatinine, Ser 0.62 0.44 - 1.00 mg/dL   Calcium 8.3 (L) 8.9 - 10.3 mg/dL   GFR calc non Af Amer >60 >60 mL/min   GFR calc Af Amer >60 >60 mL/min    Comment: (NOTE) The eGFR has been calculated using the CKD EPI equation. This calculation has not been validated in all clinical situations. eGFR's persistently <60 mL/min signify possible Chronic Kidney Disease.    Anion gap 9 5 - 15  CBC with Differential/Platelet     Status: Abnormal   Collection Time: 11/02/15  3:09 AM  Result Value Ref Range   WBC 12.8 (H) 4.0 - 10.5 K/uL   RBC 3.95 3.87 - 5.11 MIL/uL   Hemoglobin 12.1 12.0 - 15.0 g/dL   HCT 37.1 36.0 - 46.0 %   MCV 93.9 78.0 - 100.0 fL   MCH 30.6 26.0 - 34.0 pg   MCHC 32.6 30.0 - 36.0 g/dL   RDW 13.5 11.5 - 15.5 %   Platelets 199 150 - 400 K/uL   Neutrophils Relative % 71 %   Neutro Abs 9.0 (H) 1.7 - 7.7 K/uL    Lymphocytes Relative 20 %   Lymphs Abs 2.6 0.7 -  4.0 K/uL   Monocytes Relative 6 %   Monocytes Absolute 0.8 0.1 - 1.0 K/uL   Eosinophils Relative 3 %   Eosinophils Absolute 0.4 0.0 - 0.7 K/uL   Basophils Relative 0 %   Basophils Absolute 0.0 0.0 - 0.1 K/uL  Triglycerides     Status: Abnormal   Collection Time: 11/02/15  3:10 AM  Result Value Ref Range   Triglycerides 332 (H) <150 mg/dL  Glucose, capillary     Status: None   Collection Time: 11/02/15  4:17 AM  Result Value Ref Range   Glucose-Capillary 89 65 - 99 mg/dL   Comment 1 Capillary Specimen    Comment 2 Notify RN    Comment 3 Document in Chart   Glucose, capillary     Status: None   Collection Time: 11/02/15  7:53 AM  Result Value Ref Range   Glucose-Capillary 96 65 - 99 mg/dL   Comment 1 Notify RN   Triglycerides     Status: Abnormal   Collection Time: 11/02/15 11:19 AM  Result Value Ref Range   Triglycerides 280 (H) <150 mg/dL  Glucose, capillary     Status: None   Collection Time: 11/02/15 11:19 AM  Result Value Ref Range   Glucose-Capillary 92 65 - 99 mg/dL   Comment 1 Notify RN   Glucose, capillary     Status: None   Collection Time: 11/02/15  3:34 PM  Result Value Ref Range   Glucose-Capillary 95 65 - 99 mg/dL  Glucose, capillary     Status: None   Collection Time: 11/02/15  8:18 PM  Result Value Ref Range   Glucose-Capillary 82 65 - 99 mg/dL   Comment 1 Capillary Specimen    Comment 2 Notify RN    Comment 3 Document in Chart   Glucose, capillary     Status: None   Collection Time: 11/03/15 12:12 AM  Result Value Ref Range   Glucose-Capillary 82 65 - 99 mg/dL   Comment 1 Venous Specimen    Comment 2 Document in Chart   Glucose, capillary     Status: None   Collection Time: 11/03/15  4:41 AM  Result Value Ref Range   Glucose-Capillary 85 65 - 99 mg/dL   Comment 1 Capillary Specimen    Comment 2 Notify RN   Glucose, capillary     Status: None   Collection Time: 11/03/15  8:14 AM  Result Value Ref  Range   Glucose-Capillary 75 65 - 99 mg/dL   Comment 1 Capillary Specimen    Comment 2 Notify RN   Comprehensive metabolic panel     Status: Abnormal   Collection Time: 11/03/15 10:09 AM  Result Value Ref Range   Sodium 140 135 - 145 mmol/L   Potassium 3.5 3.5 - 5.1 mmol/L   Chloride 111 101 - 111 mmol/L   CO2 23 22 - 32 mmol/L   Glucose, Bld 90 65 - 99 mg/dL   BUN 11 6 - 20 mg/dL   Creatinine, Ser 0.57 0.44 - 1.00 mg/dL   Calcium 8.6 (L) 8.9 - 10.3 mg/dL   Total Protein 5.6 (L) 6.5 - 8.1 g/dL   Albumin 2.7 (L) 3.5 - 5.0 g/dL   AST 399 (H) 15 - 41 U/L   ALT 197 (H) 14 - 54 U/L   Alkaline Phosphatase 67 38 - 126 U/L   Total Bilirubin 1.1 0.3 - 1.2 mg/dL   GFR calc non Af Amer >60 >60 mL/min   GFR calc  Af Amer >60 >60 mL/min    Comment: (NOTE) The eGFR has been calculated using the CKD EPI equation. This calculation has not been validated in all clinical situations. eGFR's persistently <60 mL/min signify possible Chronic Kidney Disease.    Anion gap 6 5 - 15    Current Facility-Administered Medications  Medication Dose Route Frequency Provider Last Rate Last Dose  . 0.45 % sodium chloride infusion   Intravenous Continuous Juliet Rude, MD 100 mL/hr at 11/03/15 1051 100 mL/hr at 11/03/15 1051  . 0.9 %  sodium chloride infusion  250 mL Intravenous PRN Donita Brooks, NP      . acetaminophen (TYLENOL) solution 650 mg  650 mg Per Tube Q6H PRN Javier Glazier, MD   650 mg at 10/31/15 0008  . bisacodyl (DULCOLAX) suppository 10 mg  10 mg Rectal Daily PRN Donita Brooks, NP      . ceFAZolin (ANCEF) IVPB 2g/100 mL premix  2 g Intravenous Q8H Donalynn Furlong [place], RPH   2 g at 11/03/15 1212  . chlorhexidine (PERIDEX) 0.12 % solution 15 mL  15 mL Mouth Rinse BID Chesley Mires, MD   15 mL at 11/03/15 0852  . citalopram (CELEXA) tablet 20 mg  20 mg Oral Daily Juliet Rude, MD   20 mg at 11/03/15 1050  . docusate (COLACE) 50 MG/5ML liquid 100 mg  100 mg Per Tube BID PRN Donita Brooks, NP       . famotidine (PEPCID) IVPB 20 mg premix  20 mg Intravenous Q12H Chesley Mires, MD   20 mg at 11/03/15 1000  . feeding supplement (BOOST / RESOURCE BREEZE) liquid 1 Container  1 Container Oral TID BM Chesley Mires, MD      . feeding supplement (PRO-STAT SUGAR FREE 64) liquid 30 mL  30 mL Per Tube BID Donita Brooks, NP   Stopped at 11/01/15 0940  . feeding supplement (VITAL HIGH PROTEIN) liquid 1,000 mL  1,000 mL Per Tube Q24H Donita Brooks, NP   Stopped at 11/01/15 1115  . folic acid injection 1 mg  1 mg Intravenous Daily Donita Brooks, NP   1 mg at 11/03/15 1000  . heparin injection 5,000 Units  5,000 Units Subcutaneous Q8H Donita Brooks, NP   5,000 Units at 11/03/15 0610  . HYDROmorphone (DILAUDID) injection 1 mg  1 mg Intravenous Q3H PRN Chesley Mires, MD   1 mg at 11/03/15 1001  . MEDLINE mouth rinse  15 mL Mouth Rinse QID Chesley Mires, MD   15 mL at 11/03/15 0400  . ondansetron (ZOFRAN) injection 4 mg  4 mg Intravenous Q6H PRN Donita Brooks, NP      . potassium chloride SA (K-DUR,KLOR-CON) CR tablet 20 mEq  20 mEq Oral BID Chesley Mires, MD   20 mEq at 11/03/15 1211  . thiamine (B-1) injection 100 mg  100 mg Intravenous Daily Donita Brooks, NP   100 mg at 11/03/15 1000    Musculoskeletal: Strength & Muscle Tone: within normal limits Gait & Station: normal Patient leans: N/A  Psychiatric Specialty Exam: Physical Exam  ROS reports sore throat , discomfort related to intubation, reports some nausea ( has NG tube )   Blood pressure 123/82, pulse 82, temperature 99.4 F (37.4 C), temperature source Oral, resp. rate (!) 21, weight 170 lb 6.7 oz (77.3 kg), SpO2 96 %.Body mass index is 29.25 kg/m.  General Appearance: Fairly Groomed- cooperative, pleasant   Eye Contact:  Good  Speech:  Normal Rate  Volume:  Normal  Mood:  states feeling better, still depressed   Affect:  constricted but reactive , does smile at times appropriately   Thought Process:  Linear  Orientation:  Full (Time,  Place, and Person)  Thought Content:  denies hallucinations, no delusions, not internally preoccupied   Suicidal Thoughts:  No- denies any suicidal ideations, denies any self injurious ideations, denies any homicidal ideations   Homicidal Thoughts:  No  Memory:  recent and remote grossly intact  Judgement:  Other:  fair,improving  Insight:  improving   Psychomotor Activity:  Normal- no tremors, no overt restlessness   Concentration:  Concentration: Good and Attention Span: Good  Recall:  Good  Fund of Knowledge:  Good  Language:  Good  Akathisia:  NA  Handed:  Right  AIMS (if indicated):     Assets:  Desire for Improvement Resilience  ADL's:  Improving   Cognition:  Currently fully alert and attentive   Sleep:       Assessment - 30 year old female, history of substance dependence, mainly opiates , at times BZDs, recent accidental overdose, which required intubation . Endorses depression, but denies suicidal ideations, and states she is motivated in improving , seeing her child again and being a presence in her life, and continue to work on sobriety efforts . At this time not presenting with significant/ severe opiate WDL symptoms- does not appear in any acute distress and her major complaint is sore throat post extubation . Currently on Celexa, at 20 mgrs QDAY     Treatment Plan Summary: as below   Disposition: No evidence of imminent risk to self or others at present.   There are no current grounds for involuntary commitment . I reviewed possible voluntary psychiatric admission once medically cleared, or referral to a residential rehabilitation setting, but she states she would rather return to live with sober friends, participate in NA, and states she is considering  starting methadone or suboxone management to decrease cravings for heroin.  Agree with Celexa management- she  denies side effects and feels this medication helps  .  Consider restarting Neurontin trial at 100 mgrs  TID initially to address pain , anxiety   Patient  also reports Vistaril PRNs have been helpful for anxiety, insomnia- denies any side effects. If considered medically appropriate,  would consider Vistaril 25 mgrs Q 8 hours  as a PRN for anxiety if needed.  I have reviewed importance of avoiding people, places and situation she associates with drug use to minimize risk of relapse , have reviewed the dangers associated with opiates and with opiate , BZD concomitant use .  I  have strongly encouraged her to return to NA, which she states has helped her maintain sobriety in the past .     Neita Garnet, MD 11/03/2015 12:37 PM

## 2015-11-04 DIAGNOSIS — T796XXD Traumatic ischemia of muscle, subsequent encounter: Secondary | ICD-10-CM

## 2015-11-04 DIAGNOSIS — T401X1S Poisoning by heroin, accidental (unintentional), sequela: Secondary | ICD-10-CM

## 2015-11-04 LAB — BASIC METABOLIC PANEL
Anion gap: 8 (ref 5–15)
BUN: 8 mg/dL (ref 6–20)
CO2: 22 mmol/L (ref 22–32)
Calcium: 8.3 mg/dL — ABNORMAL LOW (ref 8.9–10.3)
Chloride: 106 mmol/L (ref 101–111)
Creatinine, Ser: 0.48 mg/dL (ref 0.44–1.00)
Glucose, Bld: 106 mg/dL — ABNORMAL HIGH (ref 65–99)
POTASSIUM: 3.1 mmol/L — AB (ref 3.5–5.1)
SODIUM: 136 mmol/L (ref 135–145)

## 2015-11-04 LAB — GLUCOSE, CAPILLARY
GLUCOSE-CAPILLARY: 94 mg/dL (ref 65–99)
GLUCOSE-CAPILLARY: 92 mg/dL (ref 65–99)

## 2015-11-04 LAB — CULTURE, BLOOD (ROUTINE X 2)
Culture: NO GROWTH
Culture: NO GROWTH

## 2015-11-04 MED ORDER — OXYCODONE HCL 5 MG PO TABS
5.0000 mg | ORAL_TABLET | ORAL | Status: DC | PRN
  Administered 2015-11-04 (×2): 10 mg via ORAL
  Filled 2015-11-04 (×2): qty 2

## 2015-11-04 MED ORDER — NICOTINE 21 MG/24HR TD PT24: 21.0000 mg | MEDICATED_PATCH | Freq: Every day | TRANSDERMAL | Status: DC

## 2015-11-04 MED ORDER — OXYCODONE HCL 5 MG/5ML PO SOLN
5.0000 mg | ORAL | Status: DC | PRN
  Administered 2015-11-04 – 2015-11-05 (×4): 10 mg via ORAL
  Administered 2015-11-05: 5 mg via ORAL
  Administered 2015-11-05 (×2): 10 mg via ORAL
  Administered 2015-11-05: 5 mg via ORAL
  Administered 2015-11-06 – 2015-11-07 (×8): 10 mg via ORAL
  Filled 2015-11-04 (×15): qty 10

## 2015-11-04 MED ORDER — DOCUSATE SODIUM 50 MG/5ML PO LIQD: 100.0000 mg | Freq: Two times a day (BID) | ORAL | Status: DC | PRN

## 2015-11-04 MED ORDER — ACETAMINOPHEN 325 MG PO TABS: 325.0000 mg | ORAL_TABLET | Freq: Four times a day (QID) | ORAL | Status: DC | PRN

## 2015-11-04 MED ORDER — HYDROXYZINE HCL 25 MG PO TABS: 25.0000 mg | ORAL_TABLET | Freq: Four times a day (QID) | ORAL | Status: DC | PRN

## 2015-11-04 MED ORDER — TRAMADOL HCL 50 MG PO TABS
50.0000 mg | ORAL_TABLET | Freq: Four times a day (QID) | ORAL | Status: DC | PRN
  Administered 2015-11-04: 50 mg via ORAL
  Filled 2015-11-04: qty 1

## 2015-11-04 MED ORDER — NICOTINE 21 MG/24HR TD PT24: 21.0000 mg | MEDICATED_PATCH | Freq: Every day | TRANSDERMAL | Status: DC | PRN

## 2015-11-04 MED ORDER — POTASSIUM CHLORIDE CRYS ER 20 MEQ PO TBCR
40.0000 meq | EXTENDED_RELEASE_TABLET | Freq: Once | ORAL | Status: AC
  Administered 2015-11-04: 40 meq via ORAL
  Filled 2015-11-04: qty 2

## 2015-11-04 NOTE — Progress Notes (Signed)
[place] TEAM 1 - Stepdown/ICU TEAM  Clotilde DieterHannah Kassebaum  ONG:[id number] DOB: 08/27/1985 DOA: 10/30/2015 PCP: No PCP Per Patient    Brief Narrative:  30 yo with hx of substance abuse, suicide attempt in past, living in boarding hose who was found in resp distress 9/8 and EMS was called. Transported to [facility] ED and was interactive but lethargic. GIVEN NARCAN AND BECAME COMBATIVE. Unable to maintain O2 sats and she was intubated per EDP. Noted to have hemoptysis post intubation. CXR noted extensive opacities left lung.   Significant Events: 9/8 intubated in ED 9/9 CK 48K 9/11 extubated  Subjective: The patient complains of a sore throat, sore neck, and severe soreness in both hips.  She denies chest pain shortness of breath fevers chills nausea or vomiting.  She admits to having a poor appetite.  Assessment & Plan:  Acute hypoxic resp failure - Aspiration of vomit / LUL and LLL aspiration PNA Respiratory status has stabilized  Hemoptysis post intubation Appears to have been a self-limited issue  Rhabdomyolysis CK remains quite elevated - continue IV volume resuscitation   AKI due to rhabdo - follow with ongoing hydration - creatinine has normalized   Vomiting suspect due to gag reflex  Shock liver Slowly improving - continue to hydrate  Suspected clavicular/neck cellulitis Neck CT with left sternocleidomastoid muscle trauma vs cellulitis - continue Ancef w/ stop date 9/16 as initiated by PCCM     Hypoglycemia CBG now stable   Hypokalemia Replace and follow   Substance abuse - heroin Deemed not to be a danger to herself by psychiatry consultation   DVT prophylaxis: lovenox Code Status: FULL CODE Family Communication: no family present at time of exam  Disposition Plan: Transfer to medical bed - continue to hydrate - trend CK - follow renal function  Consultants:  PCCM Psychiatry   Antimicrobials:  Zosyn 9/8 > 9/10 Vancomycin 9/8 > 9/9 Cefazolin 9/11  >  Objective: Blood pressure 116/79, pulse 66, temperature 98.5 F (36.9 C), temperature source Oral, resp. rate 20, weight 79.1 kg (174 lb 6.1 oz), SpO2 94 %.  Intake/Output Summary (Last 24 hours) at 11/04/15 1014 Last data filed at 11/04/15 0700  Gross per 24 hour  Intake             2700 ml  Output             1400 ml  Net             1300 ml   Filed Weights   11/02/15 0500 11/03/15 0433 11/04/15 0500  Weight: 79.8 kg (175 lb 14.8 oz) 77.3 kg (170 lb 6.7 oz) 79.1 kg (174 lb 6.1 oz)    Examination: General: No acute respiratory distress Lungs: Clear to auscultation bilaterally without wheezes or crackles Cardiovascular: Regular rate and rhythm without murmur gallop or rub normal S1 and S2 Abdomen: Nontender, nondistended, soft, bowel sounds positive, no rebound, no ascites, no appreciable mass Extremities: No significant cyanosis, clubbing, or edema bilateral lower extremities  CBC:  Recent Labs Lab 10/30/15 0953 10/31/15 0226 11/01/15 0228 11/02/15 0309  WBC 21.8* 20.7* 15.6* 12.8*  NEUTROABS 19.2*  --   --  9.0*  HGB 15.8* 16.2* 13.7 12.1  HCT 45.5 47.3* 42.1 37.1  MCV 92.3 92.0 93.8 93.9  PLT 298 238 211 199   Basic Metabolic Panel:  Recent Labs Lab 10/30/15 1156 10/30/15 1813 10/31/15 0226 10/31/15 1631 11/01/15 0228 11/02/15 0309 11/03/15 1009 11/04/15 0253  NA  --   --  136  --  139 141 140 136  K  --   --  4.0  --  4.0 3.8 3.5 3.1*  CL  --   --  108  --  107 110 111 106  CO2  --   --  20*  --  25 22 23 22   GLUCOSE  --   --  120*  --  115* 90 90 106*  BUN  --   --  11  --  8 9 11 8   CREATININE  --   --  0.74  --  0.53 0.62 0.57 0.48  CALCIUM  --   --  7.7*  --  8.2* 8.3* 8.6* 8.3*  MG 1.5* 1.7 1.8 1.9 1.9  --   --   --   PHOS 3.3 3.0 3.2 2.2* 2.3* 3.6  --   --    GFR: Estimated Creatinine Clearance: 105.7 mL/min (by C-G formula based on SCr of 0.48 mg/dL).  Liver Function Tests:  Recent Labs Lab 10/30/15 0953 10/31/15 0226 11/01/15 0228  11/03/15 1009  AST 470* 640* 403* 399*  ALT 180* 253* 189* 197*  ALKPHOS 122 96 76 67  BILITOT 0.6 0.7 0.7 1.1  PROT 5.9* 5.2* 5.1* 5.6*  ALBUMIN 3.3* 2.4* 2.2* 2.7*    Coagulation Profile:  Recent Labs Lab 10/30/15 1156 11/01/15 0228  INR 1.27 1.08    Cardiac Enzymes:  Recent Labs Lab 10/30/15 1156 10/31/15 0910 11/01/15 0228  CKTOTAL 48,997* 29,349* 13,950*    CBG:  Recent Labs Lab 11/03/15 1613 11/03/15 1936 11/03/15 2338 11/04/15 0344 11/04/15 0819  GLUCAP 125* 98 111* 94 92    Recent Results (from the past 240 hour(s))  Culture, blood (Routine X 2) w Reflex to ID Panel     Status: None (Preliminary result)   Collection Time: 10/30/15 12:10 PM  Result Value Ref Range Status   Specimen Description BLOOD RIGHT HAND  Final   Special Requests BOTTLES DRAWN AEROBIC AND ANAEROBIC  5CC  Final   Culture NO GROWTH 4 DAYS  Final   Report Status PENDING  Incomplete  Culture, blood (Routine X 2) w Reflex to ID Panel     Status: None (Preliminary result)   Collection Time: 10/30/15 12:32 PM  Result Value Ref Range Status   Specimen Description BLOOD LEFT HAND  Final   Special Requests IN PEDIATRIC BOTTLE  2CC  Final   Culture NO GROWTH 4 DAYS  Final   Report Status PENDING  Incomplete  MRSA PCR Screening     Status: None   Collection Time: 10/30/15  4:11 PM  Result Value Ref Range Status   MRSA by PCR NEGATIVE NEGATIVE Final    Comment:        The GeneXpert MRSA Assay (FDA approved for NASAL specimens only), is one component of a comprehensive MRSA colonization surveillance program. It is not intended to diagnose MRSA infection nor to guide or monitor treatment for MRSA infections.   Culture, respiratory (tracheal aspirate)     Status: None   Collection Time: 10/30/15  5:02 PM  Result Value Ref Range Status   Specimen Description TRACHEAL ASPIRATE  Final   Special Requests NONE  Final   Gram Stain   Final    MODERATE WBC PRESENT, PREDOMINANTLY  PMN FEW GRAM POSITIVE COCCI IN PAIRS AND CHAINS RARE GRAM NEGATIVE COCCOBACILLI RARE GRAM POSITIVE COCCI IN CLUSTERS RARE GRAM NEGATIVE DIPLOCOCCI    Culture MODERATE STAPHYLOCOCCUS AUREUS  Final  Report Status 11/02/2015 FINAL  Final   Organism ID, Bacteria STAPHYLOCOCCUS AUREUS  Final      Susceptibility   Staphylococcus aureus - MIC*    CIPROFLOXACIN <=0.5 SENSITIVE Sensitive     ERYTHROMYCIN 0.5 SENSITIVE Sensitive     GENTAMICIN <=0.5 SENSITIVE Sensitive     OXACILLIN 0.5 SENSITIVE Sensitive     TETRACYCLINE >=16 RESISTANT Resistant     VANCOMYCIN <=0.5 SENSITIVE Sensitive     TRIMETH/SULFA <=10 SENSITIVE Sensitive     CLINDAMYCIN <=0.25 SENSITIVE Sensitive     RIFAMPIN <=0.5 SENSITIVE Sensitive     Inducible Clindamycin NEGATIVE Sensitive     * MODERATE STAPHYLOCOCCUS AUREUS     Scheduled Meds: .  ceFAZolin (ANCEF) IV  2 g Intravenous Q8H  . chlorhexidine  15 mL Mouth Rinse BID  . citalopram  20 mg Oral Daily  . famotidine (PEPCID) IV  20 mg Intravenous Q12H  . feeding supplement  1 Container Oral TID BM  . feeding supplement (PRO-STAT SUGAR FREE 64)  30 mL Per Tube BID  . feeding supplement (VITAL HIGH PROTEIN)  1,000 mL Per Tube Q24H  . folic acid  1 mg Oral Daily  . heparin subcutaneous  5,000 Units Subcutaneous Q8H  . mouth rinse  15 mL Mouth Rinse QID  . thiamine  100 mg Oral Daily   Continuous Infusions: . sodium chloride 100 mL/hr at 11/04/15 0700     LOS: 5 days   Lonia Blood, MD [facility] Office  [phone] Pager - Text Page per Loretha Stapler as per below:  On-Call/Text Page:      [email or web address]      password [email or web address]  If 7PM-7AM, please contact night-coverage [email or web address] Password [email or web address] 11/04/2015, 10:14 AM

## 2015-11-04 NOTE — Care Management Note (Signed)
Case Management Note  Patient Details  Name: Traci [name] MRN: [id number] Date of Birth: 12/06/1985  Subjective/Objective:  Pt admitted with resp distress        Action/Plan:  PTA pt living in boarding house, hx of substance abuse and suicide attempts.  Toxicology pending.  Pt is now intubated - would benefit from CSW post extubation.  CM will continue to monitor for disposition needs.     Expected Discharge Date:                  Expected Discharge Plan:     In-House Referral:  Clinical Social Work  Discharge planning Services  CM Consult  Post Acute Care Choice:    Choice offered to:     DME Arranged:    DME Agency:     HH Arranged:    HH Agency:     Status of Service:  In process, will continue to follow  If discussed at Long Length of Stay Meetings, dates discussed:   CSW consulted 9/12 Additional Comments:  [name], [name], RN 11/04/2015, 10:55 AM

## 2015-11-04 NOTE — Clinical Social Work Note (Signed)
Clinical Social Work Assessment  Patient Details  Name: Traci [name] MRN: [id number] Date of Birth: December 11, 1985  Date of referral:  11/04/15               Reason for consult:  Substance Use/ETOH Abuse                Permission sought to share information with:    Permission granted to share information::  Yes, Verbal Permission Granted  Name::        Agency::     Relationship::     Contact Information:     Housing/Transportation Living arrangements for the past 2 months:  Single Family Home Source of Information:  Patient Patient Interpreter Needed:  None Criminal Activity/Legal Involvement Pertinent to Current Situation/Hospitalization:  No - Comment as needed Significant Relationships:  Other Family Members Lives with:  Friends Do you feel safe going back to the place where you live?  Yes Need for family participation in patient care:  No (Coment)  Care giving concerns:  Patient reported she did not use substances for 10 months and she recently relapsed. She reported she has safe housing and supportive friends.   Social Worker assessment / plan: CSW met with patient at beside to complete assessment. Patient was resting comfortably in bed. Patient's friend was also at bedside. CSW asked permission to discuss senstive information with patient's friend in room. Patient gave verbal consent for CSW to complete assessment and discuss substance abuse concerns. Patient reported she goes to [employer] [facility] [employer]. CSW completed SBIRT. CSW provided patient with outpatient substance abuse resources. Patient reported she has good job and  supports and looks forward to returning to work and participating in substance abuse treatment. CSW signing off at this time.   Employment status:  [employer] information:  Self Pay (Medicaid Pending) PT Recommendations:  Not assessed at this time Information / Referral to community resources:  Residential Substance Abuse Treatment Options, Outpatient  Substance Abuse Treatment Options  Patient/Family's Response to care:  The patient appears happy with the care she is receiving in hospital and is appreciative of CSW assistance.   Patient/Family's Understanding of and Emotional Response to Diagnosis, Current Treatment, and Prognosis:  The patient has a good understanding of why she was admitted. She understands the care plan and what she will need post discharge.  Emotional Assessment Appearance:  Appears stated age Attitude/Demeanor/Rapport:   (Patient was appropriate. ) Affect (typically observed):  Accepting, Appropriate, Calm Orientation:  Oriented to Self, Oriented to Situation, Oriented to Place, Oriented to  Time Alcohol / Substance use:  Not Applicable Psych involvement (Current and /or in the community):  No (Comment)  Discharge Needs  Concerns to be addressed:  Substance Abuse Concerns Readmission within the last 30 days:  No Current discharge risk:  Substance Abuse Barriers to Discharge:  No Barriers Identified   Samule Dry, LCSW 11/04/2015, 2:21 PM

## 2015-11-05 DIAGNOSIS — F1110 Opioid abuse, uncomplicated: Secondary | ICD-10-CM

## 2015-11-05 DIAGNOSIS — M6282 Rhabdomyolysis: Secondary | ICD-10-CM | POA: Diagnosis present

## 2015-11-05 DIAGNOSIS — J690 Pneumonitis due to inhalation of food and vomit: Secondary | ICD-10-CM

## 2015-11-05 DIAGNOSIS — L0390 Cellulitis, unspecified: Secondary | ICD-10-CM

## 2015-11-05 LAB — COMPREHENSIVE METABOLIC PANEL
ALBUMIN: 2.9 g/dL — AB (ref 3.5–5.0)
ALK PHOS: 71 U/L (ref 38–126)
ALT: 159 U/L — ABNORMAL HIGH (ref 14–54)
ANION GAP: 10 (ref 5–15)
AST: 164 U/L — ABNORMAL HIGH (ref 15–41)
BILIRUBIN TOTAL: 0.5 mg/dL (ref 0.3–1.2)
BUN: <5 mg/dL — ABNORMAL LOW (ref 6–20)
CALCIUM: 8.4 mg/dL — AB (ref 8.9–10.3)
CO2: 23 mmol/L (ref 22–32)
Chloride: 107 mmol/L (ref 101–111)
Creatinine, Ser: 0.51 mg/dL (ref 0.44–1.00)
GFR calc Af Amer: >60 mL/min (ref >60)
GFR calc non Af Amer: >60 mL/min (ref >60)
GLUCOSE: 126 mg/dL — AB (ref 65–99)
POTASSIUM: 3 mmol/L — AB (ref 3.5–5.1)
Sodium: 140 mmol/L (ref 135–145)
Total Protein: 5.4 g/dL — ABNORMAL LOW (ref 6.5–8.1)

## 2015-11-05 LAB — CK: Total CK: 3285 U/L — ABNORMAL HIGH (ref 38–234)

## 2015-11-05 LAB — CBC WITH DIFFERENTIAL/PLATELET
Basophils Absolute: 0.1 10*3/uL (ref 0.0–0.1)
Basophils Relative: 1 %
Eosinophils Absolute: 0.3 10*3/uL (ref 0.0–0.7)
Eosinophils Relative: 3 %
HEMATOCRIT: 36.4 % (ref 36.0–46.0)
HEMOGLOBIN: 12.5 g/dL (ref 12.0–15.0)
LYMPHS ABS: 1.6 10*3/uL (ref 0.7–4.0)
LYMPHS PCT: 15 %
MCH: 30.9 pg (ref 26.0–34.0)
MCHC: 34.3 g/dL (ref 30.0–36.0)
MCV: 90.1 fL (ref 78.0–100.0)
MONOS PCT: 8 %
Monocytes Absolute: 0.8 10*3/uL (ref 0.1–1.0)
NEUTROS ABS: 7.6 10*3/uL (ref 1.7–7.7)
NEUTROS PCT: 73 %
Platelets: 211 10*3/uL (ref 150–400)
RBC: 4.04 MIL/uL (ref 3.87–5.11)
RDW: 12.3 % (ref 11.5–15.5)
WBC: 10.3 10*3/uL (ref 4.0–10.5)

## 2015-11-05 LAB — MAGNESIUM: Magnesium: 1.5 mg/dL — ABNORMAL LOW (ref 1.7–2.4)

## 2015-11-05 MED ORDER — MAGNESIUM SULFATE 50 % IJ SOLN
3.0000 g | Freq: Once | INTRAVENOUS | Status: AC
  Administered 2015-11-05: 3 g via INTRAVENOUS
  Filled 2015-11-05: qty 6

## 2015-11-05 NOTE — Progress Notes (Signed)
PROGRESS NOTE    Traci [name]  ZOX:[id number] DOB: 22-Jul-1985 DOA: 10/30/2015 PCP: No PCP Per Patient   Brief Narrative:  30 yo WF PMHx ADD,Depression, Substance Abuse, Suicide Attempt, multi Drug Overdose   Living in boarding hose and found in resp distress 9/8 and EMS was called. Transported to [facility] ED and was interactive but lethargic. GIVEN NARCAN AND BECAME COMBATIVE. Unable to maintain O2 sats and she was intubated per EDP. Noted to have hemoptysis post intubation. Cxr reveals extensive opacities left lung. Noted to have vomitus on shirt by ems. She was notable for red raised area left neck and left buttock. Old slice marks were noted on left wrist. No needle marks were found.She will be admitted to ICU after CT neck/head, broad spectrum abx, MVS, pan culture and toxinology screen.    Subjective: 9/14  A/O 4, states is a multiple drug overdoses. NAD   Assessment & Plan:   Active Problems:   Altered mental state   Stupor   Aspiration pneumonia of left lower lobe due to regurgitated food (HCC)   Non-traumatic rhabdomyolysis   Heroin abuse   Acute respiratory failure with hypoxia - Aspiration of vomit / LUL and LLL aspiration PNA -Respiratory status has stabilized -Exam does not support a diagnosis  Hemoptysis post intubation -Resolved  Rhabdomyolysis CK remains quite elevated  Recent Labs Lab 10/30/15 1156 10/31/15 0910 11/01/15 0228 11/05/15 0919  CKTOTAL 81,191* 47,829* 13,950* 3,285*   - continue  0.45% saline 161ml/hr  AKI -Resolved   Shock liver -Slowly improving  - continue to hydrate  Suspected clavicular/neck cellulitis Neck CT with left sternocleidomastoid muscle trauma vs cellulitis  - continue Ancef w/ stop date 9/16 as initiated by PCCM    Hypoglycemia CBG now stable   Hypokalemia -K-Dur 50 mEq  Hypomagnesemia  -Magnesium IV 3 g  Substance abuse - heroin -Deemed not to be a danger to herself by psychiatry  consultation     DVT prophylaxis: Lovenox Code Status: Full Family Communication: None Disposition Plan: Resolution rhabdomyolysis   Consultants:  PCCM Psychiatry   Procedures/Significant Events:  None  Cultures 9/8 blood right/left hand negative 9/8 MRSA by PCR negative 9/8 tracheal aspirate positive MSSA   Antimicrobials: Zosyn 9/8 > 9/10 Vancomycin 9/8 > 9/9 Cefazolin 9/11 >   Devices None   LINES / TUBES:      Continuous Infusions: . sodium chloride 100 mL/hr at 11/04/15 2300     Objective: Vitals:   11/05/15 0159 11/05/15 0201 11/05/15 0609 11/05/15 1751  BP: (!) 114/100 130/77 126/84 133/87  Pulse: 71 68 69 79  Resp: 18  18 18   Temp: 98.5 F (36.9 C)  97.6 F (36.4 C) 98.4 F (36.9 C)  TempSrc: Oral  Oral Oral  SpO2: 100% 100% 99% 100%  Weight:        Intake/Output Summary (Last 24 hours) at 11/05/15 1918 Last data filed at 11/05/15 1714  Gross per 24 hour  Intake          3166.33 ml  Output             1200 ml  Net          1966.33 ml   Filed Weights   11/02/15 0500 11/03/15 0433 11/04/15 0500  Weight: 79.8 kg (175 lb 14.8 oz) 77.3 kg (170 lb 6.7 oz) 79.1 kg (174 lb 6.1 oz)    Examination:  General: A/O 4, NAD, No acute respiratory distress Eyes: negative scleral hemorrhage, negative anisocoria, negative icterus  ENT: Negative Runny nose, negative gingival bleeding, Neck:  Negative scars, masses, torticollis, lymphadenopathy, JVD Lungs: Clear to auscultation bilaterally without wheezes or crackles Cardiovascular: Regular rate and rhythm without murmur gallop or rub normal S1 and S2 Abdomen: negative abdominal pain, nondistended, positive soft, bowel sounds, no rebound, no ascites, no appreciable mass Extremities: No significant cyanosis, clubbing, or edema bilateral lower extremities Skin: Negative rashes, lesions, ulcers Psychiatric:  Negative depression, negative anxiety, negative fatigue, negative mania  Central nervous  system:  Cranial nerves II through XII intact, tongue/uvula midline, all extremities muscle strength 5/5, sensation intact throughout, negative dysarthria, negative expressive aphasia, negative receptive aphasia.  .     Data Reviewed: Care during the described time interval was provided by me .  I have reviewed this patient's available data, including medical history, events of note, physical examination, and all test results as part of my evaluation. I have personally reviewed and interpreted all radiology studies.  CBC:  Recent Labs Lab 10/30/15 0953 10/31/15 0226 11/01/15 0228 11/02/15 0309 11/05/15 0919  WBC 21.8* 20.7* 15.6* 12.8* 10.3  NEUTROABS 19.2*  --   --  9.0* 7.6  HGB 15.8* 16.2* 13.7 12.1 12.5  HCT 45.5 47.3* 42.1 37.1 36.4  MCV 92.3 92.0 93.8 93.9 90.1  PLT 298 238 211 199 211   Basic Metabolic Panel:  Recent Labs Lab 10/30/15 1813 10/31/15 0226 10/31/15 1631 11/01/15 0228 11/02/15 0309 11/03/15 1009 11/04/15 0253 11/05/15 0919  NA  --  136  --  139 141 140 136 140  K  --  4.0  --  4.0 3.8 3.5 3.1* 3.0*  CL  --  108  --  107 110 111 106 107  CO2  --  20*  --  25 22 23 22 23   GLUCOSE  --  120*  --  115* 90 90 106* 126*  BUN  --  11  --  8 9 11 8  <5*  CREATININE  --  0.74  --  0.53 0.62 0.57 0.48 0.51  CALCIUM  --  7.7*  --  8.2* 8.3* 8.6* 8.3* 8.4*  MG 1.7 1.8 1.9 1.9  --   --   --  1.5*  PHOS 3.0 3.2 2.2* 2.3* 3.6  --   --   --    GFR: Estimated Creatinine Clearance: 105.7 mL/min (by C-G formula based on SCr of 0.51 mg/dL). Liver Function Tests:  Recent Labs Lab 10/30/15 0953 10/31/15 0226 11/01/15 0228 11/03/15 1009 11/05/15 0919  AST 470* 640* 403* 399* 164*  ALT 180* 253* 189* 197* 159*  ALKPHOS 122 96 76 67 71  BILITOT 0.6 0.7 0.7 1.1 0.5  PROT 5.9* 5.2* 5.1* 5.6* 5.4*  ALBUMIN 3.3* 2.4* 2.2* 2.7* 2.9*   No results for input(s): LIPASE, AMYLASE in the last 168 hours. No results for input(s): AMMONIA in the last 168  hours. Coagulation Profile:  Recent Labs Lab 10/30/15 1156 11/01/15 0228  INR 1.27 1.08   Cardiac Enzymes:  Recent Labs Lab 10/30/15 1156 10/31/15 0910 11/01/15 0228 11/05/15 0919  CKTOTAL 16,109* 29,349* 13,950* 3,285*   BNP (last 3 results) No results for input(s): PROBNP in the last 8760 hours. HbA1C: No results for input(s): HGBA1C in the last 72 hours. CBG:  Recent Labs Lab 11/03/15 1613 11/03/15 1936 11/03/15 2338 11/04/15 0344 11/04/15 0819  GLUCAP 125* 98 111* 94 92   Lipid Profile: No results for input(s): CHOL, HDL, LDLCALC, TRIG, CHOLHDL, LDLDIRECT in the last 72 hours. Thyroid Function Tests:  No results for input(s): TSH, T4TOTAL, FREET4, T3FREE, THYROIDAB in the last 72 hours. Anemia Panel: No results for input(s): VITAMINB12, FOLATE, FERRITIN, TIBC, IRON, RETICCTPCT in the last 72 hours. Urine analysis:    Component Value Date/Time   COLORURINE AMBER (A) 10/30/2015 1640   APPEARANCEUR CLOUDY (A) 10/30/2015 1640   LABSPEC 1.036 (H) 10/30/2015 1640   PHURINE 5.5 10/30/2015 1640   GLUCOSEU 100 (A) 10/30/2015 1640   HGBUR LARGE (A) 10/30/2015 1640   BILIRUBINUR NEGATIVE 10/30/2015 1640   KETONESUR NEGATIVE 10/30/2015 1640   PROTEINUR 100 (A) 10/30/2015 1640   NITRITE NEGATIVE 10/30/2015 1640   LEUKOCYTESUR TRACE (A) 10/30/2015 1640   Sepsis Labs: @LABRCNTIP (procalcitonin:4,lacticidven:4)  ) Recent Results (from the past 240 hour(s))  Culture, blood (Routine X 2) w Reflex to ID Panel     Status: None   Collection Time: 10/30/15 12:10 PM  Result Value Ref Range Status   Specimen Description BLOOD RIGHT HAND  Final   Special Requests BOTTLES DRAWN AEROBIC AND ANAEROBIC  5CC  Final   Culture NO GROWTH 5 DAYS  Final   Report Status 11/04/2015 FINAL  Final  Culture, blood (Routine X 2) w Reflex to ID Panel     Status: None   Collection Time: 10/30/15 12:32 PM  Result Value Ref Range Status   Specimen Description BLOOD LEFT HAND  Final    Special Requests IN PEDIATRIC BOTTLE  2CC  Final   Culture NO GROWTH 5 DAYS  Final   Report Status 11/04/2015 FINAL  Final  MRSA PCR Screening     Status: None   Collection Time: 10/30/15  4:11 PM  Result Value Ref Range Status   MRSA by PCR NEGATIVE NEGATIVE Final    Comment:        The GeneXpert MRSA Assay (FDA approved for NASAL specimens only), is one component of a comprehensive MRSA colonization surveillance program. It is not intended to diagnose MRSA infection nor to guide or monitor treatment for MRSA infections.   Culture, respiratory (tracheal aspirate)     Status: None   Collection Time: 10/30/15  5:02 PM  Result Value Ref Range Status   Specimen Description TRACHEAL ASPIRATE  Final   Special Requests NONE  Final   Gram Stain   Final    MODERATE WBC PRESENT, PREDOMINANTLY PMN FEW GRAM POSITIVE COCCI IN PAIRS AND CHAINS RARE GRAM NEGATIVE COCCOBACILLI RARE GRAM POSITIVE COCCI IN CLUSTERS RARE GRAM NEGATIVE DIPLOCOCCI    Culture MODERATE STAPHYLOCOCCUS AUREUS  Final   Report Status 11/02/2015 FINAL  Final   Organism ID, Bacteria STAPHYLOCOCCUS AUREUS  Final      Susceptibility   Staphylococcus aureus - MIC*    CIPROFLOXACIN <=0.5 SENSITIVE Sensitive     ERYTHROMYCIN 0.5 SENSITIVE Sensitive     GENTAMICIN <=0.5 SENSITIVE Sensitive     OXACILLIN 0.5 SENSITIVE Sensitive     TETRACYCLINE >=16 RESISTANT Resistant     VANCOMYCIN <=0.5 SENSITIVE Sensitive     TRIMETH/SULFA <=10 SENSITIVE Sensitive     CLINDAMYCIN <=0.25 SENSITIVE Sensitive     RIFAMPIN <=0.5 SENSITIVE Sensitive     Inducible Clindamycin NEGATIVE Sensitive     * MODERATE STAPHYLOCOCCUS AUREUS         Radiology Studies: No results found.      Scheduled Meds: .  ceFAZolin (ANCEF) IV  2 g Intravenous Q8H  . citalopram  20 mg Oral Daily  . feeding supplement  1 Container Oral TID BM  . folic acid  1  mg Oral Daily  . heparin subcutaneous  5,000 Units Subcutaneous Q8H  . thiamine  100 mg  Oral Daily   Continuous Infusions: . sodium chloride 100 mL/hr at 11/04/15 2300     LOS: 6 days    Time spent: 40 minutes    WOODS, Roselind Messier, MD [facility] Pager [phone]   If 7PM-7AM, please contact night-coverage [email or web address] Password [facility] 11/05/2015, 7:18 PM

## 2015-11-06 LAB — HIV ANTIBODY (ROUTINE TESTING W REFLEX): HIV Screen 4th Generation wRfx: NONREACTIVE

## 2015-11-06 MED ORDER — BOOST / RESOURCE BREEZE PO LIQD
1.0000 | Freq: Two times a day (BID) | ORAL | Status: DC
  Administered 2015-11-07 (×2): 1 via ORAL

## 2015-11-06 NOTE — Progress Notes (Signed)
[place] TEAM 1 - Stepdown/ICU TEAM  Dyesha Henault  ZOX:[id number] DOB: [birth year] DOA: 10/30/2015 PCP: No PCP Per Patient    Brief Narrative:  30 yo with hx of substance abuse, suicide attempt in past, living in boarding hose who was found in resp distress 9/8 and EMS was called. Transported to [facility] ED and was interactive but lethargic. GIVEN NARCAN AND BECAME COMBATIVE. Unable to maintain O2 sats and she was intubated per EDP. Noted to have hemoptysis post intubation. CXR noted extensive opacities left lung.   Significant Events: 9/8 intubated in ED 9/9 CK 48K 9/11 extubated  Subjective: The patient reports some ongoing achiness in both hips.  She denies chest pain shortness breath fevers chills nausea or vomiting.  Overall she states she's feeling much better.  Assessment & Plan:  Acute hypoxic resp failure - Aspiration of vomit / LUL and LLL aspiration PNA Respiratory status has stabilized  Hemoptysis post intubation Appears to have been a self-limited issue  Rhabdomyolysis CK now improving significantly - continue IV fluid support and recheck in a.m.   AKI - resolved due to rhabdo - follow with ongoing hydration - creatinine has normalized   Vomiting - resolved  suspect due to gag reflex  Shock liver LFTs are approaching normal  Suspected clavicular/neck cellulitis Neck CT with left sternocleidomastoid muscle trauma vs cellulitis - continue Ancef w/ stop date 9/16 as initiated by PCCM - clinically essentially resolved    Hypoglycemia CBG stable   Hypokalemia Cont to replace and follow   Substance abuse - heroin Deemed not to be a danger to herself by psychiatry consultation   DVT prophylaxis: lovenox Code Status: FULL CODE Family Communication: no family present at time of exam  Disposition Plan: continue to hydrate - trend CK - follow renal function- anticipate d/c home in AM   Consultants:  PCCM Psychiatry   Antimicrobials:  Zosyn 9/8 >  9/10 Vancomycin 9/8 > 9/9 Cefazolin 9/11 >  Objective: Blood pressure 124/71, pulse 71, temperature 98.7 F (37.1 C), temperature source Oral, resp. rate 14, weight 79.1 kg (174 lb 6.1 oz), SpO2 100 %.  Intake/Output Summary (Last 24 hours) at 11/06/15 1638 Last data filed at 11/06/15 1500  Gross per 24 hour  Intake          5091.33 ml  Output              950 ml  Net          4141.33 ml   Filed Weights   11/02/15 0500 11/03/15 0433 11/04/15 0500  Weight: 79.8 kg (175 lb 14.8 oz) 77.3 kg (170 lb 6.7 oz) 79.1 kg (174 lb 6.1 oz)    Examination: General: No acute respiratory distress - alert  Lungs: Clear to auscultation bilaterally  Cardiovascular: Regular rate and rhythm without murmur Abdomen: Nontender, nondistended, soft, bowel sounds positive, no rebound, no ascites, no appreciable mass Extremities: No significant cyanosis, clubbing, edema bilateral lower extremities  CBC:  Recent Labs Lab 10/31/15 0226 11/01/15 0228 11/02/15 0309 11/05/15 0919  WBC 20.7* 15.6* 12.8* 10.3  NEUTROABS  --   --  9.0* 7.6  HGB 16.2* 13.7 12.1 12.5  HCT 47.3* 42.1 37.1 36.4  MCV 92.0 93.8 93.9 90.1  PLT 238 211 199 211   Basic Metabolic Panel:  Recent Labs Lab 10/30/15 1813  10/31/15 0226 10/31/15 1631 11/01/15 0228 11/02/15 0309 11/03/15 1009 11/04/15 0253 11/05/15 0919  NA  --   < > 136  --  139 141  140 136 140  K  --   < > 4.0  --  4.0 3.8 3.5 3.1* 3.0*  CL  --   < > 108  --  107 110 111 106 107  CO2  --   < > 20*  --  25 22 23 22 23   GLUCOSE  --   < > 120*  --  115* 90 90 106* 126*  BUN  --   < > 11  --  8 9 11 8  <5*  CREATININE  --   < > 0.74  --  0.53 0.62 0.57 0.48 0.51  CALCIUM  --   < > 7.7*  --  8.2* 8.3* 8.6* 8.3* 8.4*  MG 1.7  --  1.8 1.9 1.9  --   --   --  1.5*  PHOS 3.0  --  3.2 2.2* 2.3* 3.6  --   --   --   < > = values in this interval not displayed.   GFR: Estimated Creatinine Clearance: 105.7 mL/min (by C-G formula based on SCr of 0.51  mg/dL).  Liver Function Tests:  Recent Labs Lab 10/31/15 0226 11/01/15 0228 11/03/15 1009 11/05/15 0919  AST 640* 403* 399* 164*  ALT 253* 189* 197* 159*  ALKPHOS 96 76 67 71  BILITOT 0.7 0.7 1.1 0.5  PROT 5.2* 5.1* 5.6* 5.4*  ALBUMIN 2.4* 2.2* 2.7* 2.9*    Coagulation Profile:  Recent Labs Lab 11/01/15 0228  INR 1.08    Cardiac Enzymes:  Recent Labs Lab 10/31/15 0910 11/01/15 0228 11/05/15 0919  CKTOTAL 29,349* 13,950* 3,285*    CBG:  Recent Labs Lab 11/03/15 1613 11/03/15 1936 11/03/15 2338 11/04/15 0344 11/04/15 0819  GLUCAP 125* 98 111* 94 92    Recent Results (from the past 240 hour(s))  Culture, blood (Routine X 2) w Reflex to ID Panel     Status: None   Collection Time: 10/30/15 12:10 PM  Result Value Ref Range Status   Specimen Description BLOOD RIGHT HAND  Final   Special Requests BOTTLES DRAWN AEROBIC AND ANAEROBIC  5CC  Final   Culture NO GROWTH 5 DAYS  Final   Report Status 11/04/2015 FINAL  Final  Culture, blood (Routine X 2) w Reflex to ID Panel     Status: None   Collection Time: 10/30/15 12:32 PM  Result Value Ref Range Status   Specimen Description BLOOD LEFT HAND  Final   Special Requests IN PEDIATRIC BOTTLE  2CC  Final   Culture NO GROWTH 5 DAYS  Final   Report Status 11/04/2015 FINAL  Final  MRSA PCR Screening     Status: None   Collection Time: 10/30/15  4:11 PM  Result Value Ref Range Status   MRSA by PCR NEGATIVE NEGATIVE Final    Comment:        The GeneXpert MRSA Assay (FDA approved for NASAL specimens only), is one component of a comprehensive MRSA colonization surveillance program. It is not intended to diagnose MRSA infection nor to guide or monitor treatment for MRSA infections.   Culture, respiratory (tracheal aspirate)     Status: None   Collection Time: 10/30/15  5:02 PM  Result Value Ref Range Status   Specimen Description TRACHEAL ASPIRATE  Final   Special Requests NONE  Final   Gram Stain   Final     MODERATE WBC PRESENT, PREDOMINANTLY PMN FEW GRAM POSITIVE COCCI IN PAIRS AND CHAINS RARE GRAM NEGATIVE COCCOBACILLI RARE GRAM POSITIVE COCCI IN CLUSTERS  RARE GRAM NEGATIVE DIPLOCOCCI    Culture MODERATE STAPHYLOCOCCUS AUREUS  Final   Report Status 11/02/2015 FINAL  Final   Organism ID, Bacteria STAPHYLOCOCCUS AUREUS  Final      Susceptibility   Staphylococcus aureus - MIC*    CIPROFLOXACIN <=0.5 SENSITIVE Sensitive     ERYTHROMYCIN 0.5 SENSITIVE Sensitive     GENTAMICIN <=0.5 SENSITIVE Sensitive     OXACILLIN 0.5 SENSITIVE Sensitive     TETRACYCLINE >=16 RESISTANT Resistant     VANCOMYCIN <=0.5 SENSITIVE Sensitive     TRIMETH/SULFA <=10 SENSITIVE Sensitive     CLINDAMYCIN <=0.25 SENSITIVE Sensitive     RIFAMPIN <=0.5 SENSITIVE Sensitive     Inducible Clindamycin NEGATIVE Sensitive     * MODERATE STAPHYLOCOCCUS AUREUS     Scheduled Meds: .  ceFAZolin (ANCEF) IV  2 g Intravenous Q8H  . citalopram  20 mg Oral Daily  . [START ON 11/07/2015] feeding supplement  1 Container Oral BID BM  . folic acid  1 mg Oral Daily  . heparin subcutaneous  5,000 Units Subcutaneous Q8H  . thiamine  100 mg Oral Daily   Continuous Infusions: . sodium chloride 100 mL/hr at 11/06/15 1448     LOS: 7 days   Lonia BloodJeffrey T. Xander Jutras, MD [facility] Office  [phone] Pager - Text Page per Loretha StaplerAmion as per below:  On-Call/Text Page:      [email or web address]      password [email or web address]  If 7PM-7AM, please contact night-coverage [email or web address] Password [email or web address] 11/06/2015, 4:38 PM

## 2015-11-06 NOTE — Progress Notes (Signed)
Nutrition Follow-up  DOCUMENTATION CODES:   Not applicable  INTERVENTION:   -Decrease Boost Breeze po to BID, each supplement provides 250 kcal and 9 grams of protein  NUTRITION DIAGNOSIS:   Increased nutrient needs related to acute illness as evidenced by estimated needs.  Progressing  GOAL:   Patient will meet greater than or equal to 90% of their needs  Progressing  MONITOR:   PO intake, Supplement acceptance, Labs, Weight trends, I & O's  REASON FOR ASSESSMENT:   Ventilator, Consult Enteral/tube feeding initiation and management  ASSESSMENT:   29 yo with hx of substance abuse, suicide attempt in past, living in boarding hose and found in res59p distress 9/8 and EMS was called. [facility] and was interactive but lethargic. GIVEN NARCAN AND BECAME COMBATIVE. Unable to maintain O2 sats and she was intubated per EDP. Noted to have hemoptysis post intubation. Cxr reveals extensive opacities left lung. Noted to have vomitus on shirt by ems. She was notable for red raised area left neck and left buttock.   Pt transferred from ICU to medical floor on 11/05/15.   Pt in with MD at time of visit. Unable to speak with pt at time of visit.   Pt advanced from clear liquids to regular diet on 11/04/15. Pt with good appetite; consuming 100% of meals. Per MAR, pt is also consuming Boost Breeze supplements. Will decrease frequency due to improvements in PO intake.   Pt underwent psych evaluation and it was determine pt is not a danger to herself. CSW following for substance abuse.   Labs reviewed: K: 3.0.  Diet Order:  Diet regular Room service appropriate? Yes; Fluid consistency: Thin  Skin:  Reviewed, no issues  Last BM:  11/05/15  Height:   Ht Readings from Last 1 Encounters:  08/03/15 5\' 4"  (1.626 m)    Weight:   Wt Readings from Last 1 Encounters:  11/04/15 174 lb 6.1 oz (79.1 kg)    Ideal Body Weight:  54.54 kg  BMI:  Body mass index is 29.93  kg/m.  Estimated Nutritional Needs:   Kcal:  1900-2100  Protein:  110-120 gm  Fluid:  1.9-2.1 L  EDUCATION NEEDS:   No education needs identified at this time  Chevis Weisensel A. Mayford KnifeWilliams, RD, LDN, CDE Pager: [phone] After hours Pager: [phone]

## 2015-11-07 LAB — COMPREHENSIVE METABOLIC PANEL
ALK PHOS: 61 U/L (ref 38–126)
ALT: 100 U/L — AB (ref 14–54)
AST: 71 U/L — AB (ref 15–41)
Albumin: 2.9 g/dL — ABNORMAL LOW (ref 3.5–5.0)
Anion gap: 9 (ref 5–15)
BUN: 7 mg/dL (ref 6–20)
CALCIUM: 8.8 mg/dL — AB (ref 8.9–10.3)
CHLORIDE: 108 mmol/L (ref 101–111)
CO2: 25 mmol/L (ref 22–32)
CREATININE: 0.55 mg/dL (ref 0.44–1.00)
GFR calc Af Amer: >60 mL/min (ref >60)
Glucose, Bld: 96 mg/dL (ref 65–99)
Potassium: 3.9 mmol/L (ref 3.5–5.1)
Sodium: 142 mmol/L (ref 135–145)
Total Bilirubin: 0.6 mg/dL (ref 0.3–1.2)
Total Protein: 5.2 g/dL — ABNORMAL LOW (ref 6.5–8.1)

## 2015-11-07 LAB — CK: CK TOTAL: 1019 U/L — AB (ref 38–234)

## 2015-11-07 LAB — HEPATITIS PANEL, ACUTE
HEP B S AG: NEGATIVE
Hep A IgM: NEGATIVE
Hep B C IgM: NEGATIVE

## 2015-11-07 LAB — MAGNESIUM: MAGNESIUM: 1.8 mg/dL (ref 1.7–2.4)

## 2015-11-07 MED ORDER — TRAMADOL HCL 50 MG PO TABS: 50.0000 mg | ORAL_TABLET | Freq: Four times a day (QID) | ORAL | 0 refills | Status: AC | PRN

## 2015-11-07 NOTE — Discharge Summary (Signed)
DISCHARGE SUMMARY  Traci DieterHannah [name]  MR#: [id number]  DOB:07/28/1985  Date of Admission: 10/30/2015 Date of Discharge: 11/07/2015  Attending Physician:Jameeka Marcy T  Patient's PCP:No PCP Per Patient  Consults: PCCM Psychiatry   Disposition: D/C home   Follow-up Appts: Follow-up Information    [facility]. Schedule an appointment as soon as possible for a visit in 1 week(s).   Contact information: [address] [place] [phone]          Discharge Diagnoses: Acute hypoxic resp failure - Aspiration of vomit / LUL and LLL aspiration PNA Hemoptysis post intubation Rhabdomyolysis AKI - resolved Vomiting - resolved  Shock liver Suspected clavicular/neck cellulitis Hypoglycemia Hypokalemia Substance abuse - heroin   Initial presentation: 30 yo with hx of substance abuse, suicide attempt in past, living in boarding house who was found in resp distress 9/8 and EMS was called. [facility] and was interactive but lethargic. GIVEN NARCAN AND BECAME COMBATIVE. Unable to maintain O2 sats and she was intubated per EDP. Noted to have hemoptysis post intubation. CXR noted extensive opacities left lung.   Hospital Course:  Significant Events: 9/8 intubated in ED 9/9 CK 48K 9/11 extubated  Acute hypoxic resp failure - Aspiration of vomit / LUL and LLL aspiration PNA Respiratory status has stabilized - normal sats on RA at time of d/c   Hemoptysis post intubation Appears to have been a self-limited issue  Rhabdomyolysis CK improved significantly, and on rapid downward trend at time of d/c - pt counseled on need to keep hydrate, and to seek medical attention should she develop worsening muscular pain/cramps   AKI - resolved due to rhabdo - corrected w/ hydration - creatinine has normalized  Vomiting - resolved  suspect due to gag reflex  Shock liver LFTs much improved and nearly normal  at time of d/c   Suspected clavicular/neck cellulitis Neck CT with left sternocleidomastoid muscle trauma vs cellulitis - continued Ancef through 9/16 - clinically resolved   Hypoglycemia CBG stable   Hypokalemia Corrected  Substance abuse - heroin Deemed not to be a danger to herself by Psychiatry     Medication List    STOP taking these medications   ALPRAZolam 0.5 MG tablet Commonly known as:  XANAX   nicotine 21 mg/24hr patch Commonly known as:  NICODERM CQ - dosed in mg/24 hours     TAKE these medications   acetaminophen 325 MG tablet Commonly known as:  TYLENOL Take 325 mg by mouth every 6 (six) hours as needed for moderate pain.   busPIRone 15 MG tablet Commonly known as:  BUSPAR Take 15 mg by mouth 2 (two) times daily.   citalopram 20 MG tablet Commonly known as:  CELEXA Take 20 mg by mouth daily. What changed:  Another medication with the same name was removed. Continue taking this medication, and follow the directions you see here.   gabapentin 100 MG capsule Commonly known as:  NEURONTIN Take 2 capsules (200 mg total) by mouth 3 (three) times daily.   hydrOXYzine 25 MG tablet Commonly known as:  ATARAX/VISTARIL Take 1 tablet (25 mg total) by mouth every 6 (six) hours as needed for anxiety.   traMADol 50 MG tablet Commonly known as:  ULTRAM Take 1 tablet (50 mg total) by mouth every 6 (six) hours as needed for moderate pain.   zolpidem 5 MG tablet Commonly known as:  AMBIEN Take 1 tablet (5 mg total) by mouth at bedtime as  needed for sleep.       Day of Discharge BP 119/79 (BP Location: Left Arm)   Pulse 62   Temp 97.4 F (36.3 C) (Oral)   Resp 18   Wt 79.1 kg (174 lb 6.1 oz)   LMP  (LMP Unknown)   SpO2 100%   BMI 29.93 kg/m   Physical Exam: General: No acute respiratory distress Lungs: Clear to auscultation bilaterally without wheezes or crackles Cardiovascular: Regular rate and rhythm without murmur gallop or rub normal S1  and S2 Abdomen: Nontender, nondistended, soft, bowel sounds positive, no rebound, no ascites, no appreciable mass Extremities: No significant cyanosis, clubbing, or edema bilateral lower extremities  Basic Metabolic Panel:  Recent Labs Lab 10/31/15 1631  11/01/15 0228 11/02/15 0309 11/03/15 1009 11/04/15 0253 11/05/15 0919 11/07/15 0515  NA  --   < > 139 141 140 136 140 142  K  --   < > 4.0 3.8 3.5 3.1* 3.0* 3.9  CL  --   < > 107 110 111 106 107 108  CO2  --   < > 25 22 23 22 23 25   GLUCOSE  --   < > 115* 90 90 106* 126* 96  BUN  --   < > 8 9 11 8  <5* 7  CREATININE  --   < > 0.53 0.62 0.57 0.48 0.51 0.55  CALCIUM  --   < > 8.2* 8.3* 8.6* 8.3* 8.4* 8.8*  MG 1.9  --  1.9  --   --   --  1.5* 1.8  PHOS 2.2*  --  2.3* 3.6  --   --   --   --   < > = values in this interval not displayed.  Liver Function Tests:  Recent Labs Lab 11/01/15 0228 11/03/15 1009 11/05/15 0919 11/07/15 0515  AST 403* 399* 164* 71*  ALT 189* 197* 159* 100*  ALKPHOS 76 67 71 61  BILITOT 0.7 1.1 0.5 0.6  PROT 5.1* 5.6* 5.4* 5.2*  ALBUMIN 2.2* 2.7* 2.9* 2.9*    Coags:  Recent Labs Lab 11/01/15 0228  INR 1.08    CBC:  Recent Labs Lab 11/01/15 0228 11/02/15 0309 11/05/15 0919  WBC 15.6* 12.8* 10.3  NEUTROABS  --  9.0* 7.6  HGB 13.7 12.1 12.5  HCT 42.1 37.1 36.4  MCV 93.8 93.9 90.1  PLT 211 199 211    Cardiac Enzymes:  Recent Labs Lab 11/01/15 0228 11/05/15 0919 11/07/15 0515  CKTOTAL 13,950* 3,285* 1,019*    CBG:  Recent Labs Lab 11/03/15 1613 11/03/15 1936 11/03/15 2338 11/04/15 0344 11/04/15 0819  GLUCAP 125* 98 111* 94 92    Recent Results (from the past 240 hour(s))  Culture, blood (Routine X 2) w Reflex to ID Panel     Status: None   Collection Time: 10/30/15 12:10 PM  Result Value Ref Range Status   Specimen Description BLOOD RIGHT HAND  Final   Special Requests BOTTLES DRAWN AEROBIC AND ANAEROBIC  5CC  Final   Culture NO GROWTH 5 DAYS  Final   Report  Status 11/04/2015 FINAL  Final  Culture, blood (Routine X 2) w Reflex to ID Panel     Status: None   Collection Time: 10/30/15 12:32 PM  Result Value Ref Range Status   Specimen Description BLOOD LEFT HAND  Final   Special Requests IN PEDIATRIC BOTTLE  [facility]  Final   Culture NO GROWTH 5 DAYS  Final   Report Status 11/04/2015 FINAL  Final  MRSA PCR Screening     Status: None   Collection Time: 10/30/15  4:11 PM  Result Value Ref Range Status   MRSA by PCR NEGATIVE NEGATIVE Final    Comment:        The GeneXpert MRSA Assay (FDA approved for NASAL specimens only), is one component of a comprehensive MRSA colonization surveillance program. It is not intended to diagnose MRSA infection nor to guide or monitor treatment for MRSA infections.   Culture, respiratory (tracheal aspirate)     Status: None   Collection Time: 10/30/15  5:02 PM  Result Value Ref Range Status   Specimen Description TRACHEAL ASPIRATE  Final   Special Requests NONE  Final   Gram Stain   Final    MODERATE WBC PRESENT, PREDOMINANTLY PMN FEW GRAM POSITIVE COCCI IN PAIRS AND CHAINS RARE GRAM NEGATIVE COCCOBACILLI RARE GRAM POSITIVE COCCI IN CLUSTERS RARE GRAM NEGATIVE DIPLOCOCCI    Culture MODERATE STAPHYLOCOCCUS AUREUS  Final   Report Status 11/02/2015 FINAL  Final   Organism ID, Bacteria STAPHYLOCOCCUS AUREUS  Final      Susceptibility   Staphylococcus aureus - MIC*    CIPROFLOXACIN <=0.5 SENSITIVE Sensitive     ERYTHROMYCIN 0.5 SENSITIVE Sensitive     GENTAMICIN <=0.5 SENSITIVE Sensitive     OXACILLIN 0.5 SENSITIVE Sensitive     TETRACYCLINE >=16 RESISTANT Resistant     VANCOMYCIN <=0.5 SENSITIVE Sensitive     TRIMETH/SULFA <=10 SENSITIVE Sensitive     CLINDAMYCIN <=0.25 SENSITIVE Sensitive     RIFAMPIN <=0.5 SENSITIVE Sensitive     Inducible Clindamycin NEGATIVE Sensitive     * MODERATE STAPHYLOCOCCUS AUREUS     Time spent in discharge (includes decision making & examination of pt): >30  minutes  11/07/2015, 12:58 PM   Lonia Blood, MD [facility] Office  [phone] Pager [phone]  On-Call/Text Page:      [email or web address]      password [facility]

## 2015-11-07 NOTE — Progress Notes (Signed)
NURSING PROGRESS NOTE  Traci [name] [place] [id number] Discharge Data: 11/07/2015 3:21 PM Attending Provider: Lonia BloodJeffrey T McClung, MD PCP:No PCP Per Patient     Traci [name] Digiacomo to be D/C'd Home per MD order.  Discussed with the patient the After Visit Summary and all questions fully answered. All IV's discontinued with no bleeding noted. All belongings returned to patient for patient to take home. Pt was given the written rx for Tramadol to take to the pharmacy.   Last Vital Signs:  Blood pressure 108/71, pulse 64, temperature 98.8 F (37.1 C), resp. rate 18, weight 79.1 kg (174 lb 6.1 oz), SpO2 99 %.  Discharge Medication List   Medication List    STOP taking these medications   ALPRAZolam 0.5 MG tablet Commonly known as:  XANAX   nicotine 21 mg/24hr patch Commonly known as:  NICODERM CQ - dosed in mg/24 hours     TAKE these medications   acetaminophen 325 MG tablet Commonly known as:  TYLENOL Take 325 mg by mouth every 6 (six) hours as needed for moderate pain.   busPIRone 15 MG tablet Commonly known as:  BUSPAR Take 15 mg by mouth 2 (two) times daily.   citalopram 20 MG tablet Commonly known as:  CELEXA Take 20 mg by mouth daily. What changed:  Another medication with the same name was removed. Continue taking this medication, and follow the directions you see here.   gabapentin 100 MG capsule Commonly known as:  NEURONTIN Take 2 capsules (200 mg total) by mouth 3 (three) times daily.   hydrOXYzine 25 MG tablet Commonly known as:  ATARAX/VISTARIL Take 1 tablet (25 mg total) by mouth every 6 (six) hours as needed for anxiety.   traMADol 50 MG tablet Commonly known as:  ULTRAM Take 1 tablet (50 mg total) by mouth every 6 (six) hours as needed for moderate pain.   zolpidem 5 MG tablet Commonly known as:  AMBIEN Take 1 tablet (5 mg total) by mouth at bedtime as needed for sleep.

## 2015-11-07 NOTE — Discharge Instructions (Signed)
Opioid Use Disorder  Opioid use disorder is a mental disorder. It is the continued nonmedical use of opioids in spite of risks to health and well-being. Misused opioids include the street drug heroin. They also include pain medicines such as morphine, hydrocodone, oxycodone, and fentanyl. Opioids are very addictive. People who misuse opioids get an exaggerated feeling of well-being. Opioid use disorder often disrupts activities at home, work, or school. It may cause mental or physical problems.  A family history of opioid use disorder puts you at higher risk of it. People with opioid use disorder often misuse other drugs or have mental illness such as depression, posttraumatic stress disorder, or antisocial personality disorder. They also are at risk of suicide and death from overdose. SIGNS AND SYMPTOMS  Signs and symptoms of opioid use disorder include:  Use of opioids in larger amounts or over a longer period than intended.  Unsuccessful attempts to cut down or control opioid use.  A lot of time spent obtaining, using, or recovering from the effects of opioids.  A strong desire or urge to use opioids (craving).  Continued use of opioids in spite of major problems at work, school, or home because of use.  Continued use of opioids in spite of relationship problems because of use.  Giving up or cutting down on important life activities because of opioid use.  Use of opioids over and over in situations when it is physically hazardous, such as driving a car.  Continued use of opioids in spite of a physical problem that is likely related to use. Physical problems can include:  Severe constipation.  Poor nutrition.  Infertility.  Tuberculosis.  Aspiration pneumonia.  Infections such as human immunodeficiency virus (HIV) and hepatitis (from injecting opioids).  Continued use of opioids in spite of a mental problem that is likely related to use. Mental problems can  include:  Depression.  Anxiety.  Hallucinations.  Sleep problems.  Loss of sexual function.  Need to use more and more opioids to get the same effect, or lessened effect over time with use of the same amount (tolerance).  Having withdrawal symptoms when opioid use is stopped, or using opioids to reduce or avoid withdrawal symptoms. Withdrawal symptoms include:  Depressed, anxious, or irritable mood.  Nausea, vomiting, diarrhea, or intestinal cramping.  Muscle aches or spasms.  Excessive tearing or runny nose.  Dilated pupils, sweating, or hairs standing on end.  Yawning.  Fever, raised blood pressure, or fast pulse.  Restlessness or trouble sleeping. This does not apply to people taking opioids for medical reasons only. DIAGNOSIS Opioid use disorder is diagnosed by your health care provider. You may be asked questions about your opioid use and and how it affects your life. A physical exam may be done. A drug screen may be ordered. You may be referred to a mental health professional. The diagnosis of opioid use disorder requires at least two symptoms within 12 months. The type of opioid use disorder you have depends on the number of signs and symptoms you have. The type may be:  Mild. Two or three signs and symptoms.   Moderate. Four or five signs and symptoms.   Severe. Six or more signs and symptoms. TREATMENT  Treatment is usually provided by mental health professionals with training in substance use disorders.The following options are available:  Detoxification.This is the first step in treatment for withdrawal. It is medically supervised withdrawal with the use of medicines. These medicines lessen withdrawal symptoms. They also raise the  chance of becoming opioid free.  Counseling, also known as talk therapy. Talk therapy addresses the reasons you use opioids. It also addresses ways to keep you from using again (relapse). The goals of talk therapy are to avoid  relapse by:  Identifying and avoiding triggers for use.  Finding healthy ways to cope with stress.  Learning how to handle cravings.  Support groups. Support groups provide emotional support, advice, and guidance.  A medicine that blocks opioid receptors in your brain. This medicine can reduce opioid cravings that lead to relapse. This medicine also blocks the desired opioid effect when relapse occurs.  Opioids that are taken by mouth in place of the misused opioid (opioid maintenance treatment). These medicines satisfy cravings but are safer than commonly misused opioids. This often is the best option for people who continue to relapse with other treatments. HOME CARE INSTRUCTIONS   Take medicines only as directed by your health care provider.  Check with your health care provider before starting new medicines.  Keep all follow-up visits as directed by your health care provider. SEEK MEDICAL CARE IF:  You are not able to take your medicines as directed.  Your symptoms get worse. SEEK IMMEDIATE MEDICAL CARE IF:  You have serious thoughts about hurting yourself or others.  You may have taken an overdose of opioids. FOR MORE INFORMATION  National Institute on Drug Abuse: http://www.price-smith.com/www.drugabuse.gov  Substance Abuse and Mental Health Services Administration: [employer].com.ptwww.samhsa.gov   This information is not intended to replace advice given to you by your health care provider. Make sure you discuss any questions you have with your health care provider.   Document Released: 12/05/2006 Document Revised: 02/28/2014 Document Reviewed: 02/20/2013 Elsevier Interactive Patient Education 2016 [employer]  Rhabdomyolysis  Rhabdomyolysis is a condition that results when muscle cells break down and release substances into the blood that can damage the kidneys. It happens because of damage to the muscles that move bones (skeletal muscle). When you damage this type of muscle, substances inside of your  muscle cells are released into your blood. This includes a certain protein called myoglobin. Your kidneys must filter myoglobin from your blood. Large amounts of myoglobin can cause kidney damage or kidney failure. Other substances that are released by muscle cells may upset the balance of the minerals (electrolytes) in your blood. This makes your blood become too acidic (acidosis). CAUSES This condition is caused by muscle damage. Muscle damage often results from:  Extreme overuse of the muscles.  An injury that crushes or compresses a muscle.  Use of illegal drugs, especially cocaine.  Alcohol abuse. Other possible causes include:  Prescription medicines, such as statins, amphetamines, and opiates.  Infections.  Inherited muscle diseases.  High fever.  Heatstroke.  Dehydration.  Seizures.  Surgery. RISK FACTORS This condition is more likely to develop in:  People who have a family history of muscle disease.  People who participate in extreme sports, such as marathon running.  People who have diabetes.  Older people.  People who abuse drugs or alcohol. SYMPTOMS Symptoms of this condition vary. Some people have very few symptoms, while others have many symptoms. The most common symptoms include:  Muscle pain and swelling.  Muscle weakness.  Dark urine.  Feeling weak and tired. Other symptoms include:  Nausea and vomiting.  Fever.  Pain in the abdomen.  Joint pain. Signs and symptoms of complications from rhabdomyolysis may include:  Heart rhythm abnormalities (arrhythmias).  Seizures.  Reduced urine production because of kidney failure.  Very low blood pressure (shock).  Uncontrolled bleeding. DIAGNOSIS This condition may be diagnosed based on:  Your symptoms and medical history.  A physical exam.  Blood tests to check for:  Muscle breakdown products in the blood (creatine kinase).  Myoglobin.  Acidosis.  Electrolyte  imbalances.  Urine tests to check for myoglobin. You may also have other tests to check for causes of muscle damage and to check for complications. TREATMENT Treatment for this condition focuses on keeping up your fluid level, reversing acidosis, and protecting your kidneys. Treatment may include:  Fluids and medicines given through an IV tube that is inserted into one of your veins.  Medicines, such as:  Sodium bicarbonate to reduce acidosis.  Electrolytes to restore the balance of these minerals in your body.  Hemodialysis. This treatment uses an artificial kidney machine to filter your blood while you recover. You may have this if other treatments are not helping. HOME CARE INSTRUCTIONS  Take medicines only as directed by your health care provider.  Rest at home until your health care provider says that you can return to your normal activities.  Drink enough fluid to keep your urine clear or pale yellow.  Do not exercise with great energy and effort (strenuously). Ask your health care provider what level of exercise is safe for you.  Do not abuse drugs or alcohol. If you are struggling with drug or alcohol use, ask your health care provider for help.  Keep all follow-up visits as directed by your health care provider. This is important. SEEK MEDICAL CARE IF:  You develop symptoms of rhabdomyolysis at home after treatment. SEEK IMMEDIATE MEDICAL CARE IF:  You have a seizure.  You bleed easily or cannot control bleeding.  You cannot make urine.  You have chest pain.  You have trouble breathing.   This information is not intended to replace advice given to you by your health care provider. Make sure you discuss any questions you have with your health care provider.   Document Released: 01/21/2004 Document Revised: 06/24/2014 Document Reviewed: 02/12/2014 Elsevier Interactive Patient Education [employer]

## 2015-11-11 LAB — OPIATES CONFIRMATION, URINE
CODEINE: NEGATIVE
MORPHINE: POSITIVE — AB
Morphine GC/MS Conf: >3000 ng/mL
OPIATES: POSITIVE — AB

## 2015-11-11 LAB — URINE DRUGS OF ABUSE SCREEN W ALC, ROUTINE (REF LAB)
Amphetamines, Urine: NEGATIVE ng/mL
Barbiturate, Ur: NEGATIVE ng/mL
CANNABINOID QUANT UR: NEGATIVE ng/mL
COCAINE (METAB.): NEGATIVE ng/mL
Ethanol U, Quan: NEGATIVE %
METHADONE SCREEN, URINE: NEGATIVE ng/mL
PROPOXYPHENE, URINE: NEGATIVE ng/mL
Phencyclidine, Ur: NEGATIVE ng/mL

## 2015-11-11 LAB — DRUG PROFILE 799031: BENZODIAZEPINES: NEGATIVE

## 2017-04-11 IMAGING — CR DG CHEST 1V PORT
1 series · 1 of 1 positions shown · non-contrast
Comparison: Portable chest x-ray of earlier today.

CLINICAL DATA: Intubated patient for respiratory failure. ,
substance abuse, current smoker.

EXAM:
PORTABLE CHEST 1 VIEW

[portable]
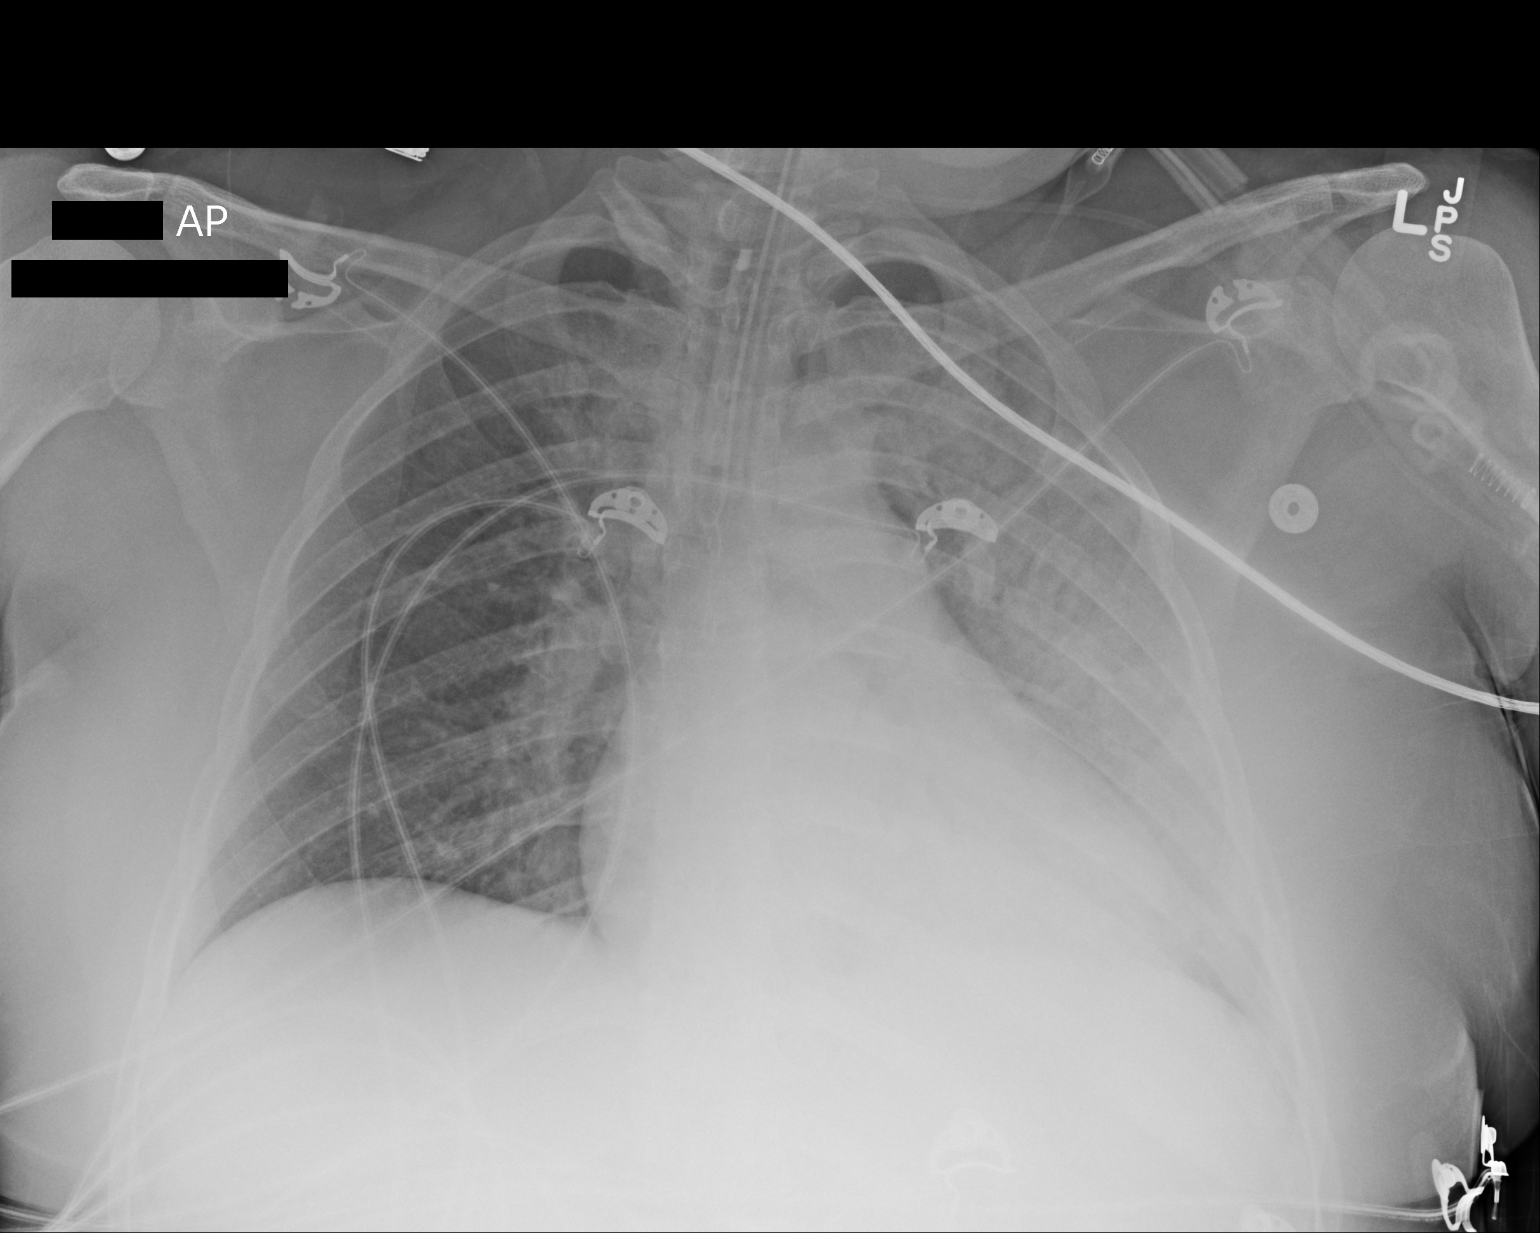

[1 of 1 positions shown; findings below may reference images not displayed]

FINDINGS: There remain confluent alveolar opacities throughout the left lung.
Subtle increased interstitial density on the right has developed.
The cardiac silhouette remains mildly enlarged. The central
pulmonary vascularity is prominent. The endotracheal tube tip lies
approximately 1.5 cm above the carina. The observed bony thorax
exhibits no acute abnormality.
IMPRESSION: Worsening of alveolar opacities in the left lung with mild interval
increase in interstitial density in the right lung. Mild stable
cardiomegaly.

The endotracheal tube tip is 1.5 cm above the carina and withdrawal
by 2 cm is recommended to avoid accidental mainstem bronchus
intubation.

## 2017-04-11 IMAGING — CT CT CHEST W/ CM
5 of 8 series · 17 of 36 positions shown, 18 images · IV contrast (APPLIED)
Comparison: Chest x-ray 10/30/2015

CLINICAL DATA: Altered mental status. Patient found to be minimally
responsive.

EXAM:
CT CHEST WITH CONTRAST
TECHNIQUE: Multidetector CT imaging of the chest was performed during
intravenous contrast administration.
CONTRAST:  75mL UCDZWB-PSS IOPAMIDOL (UCDZWB-PSS) INJECTION 61%

[Series 2: cap 5.0 i31f 1 · axial · 0.67mm/px · z∈[-456,-311]mm · 3 of 59 slices shown, 4 images]
[im 15/59  mediastinal]
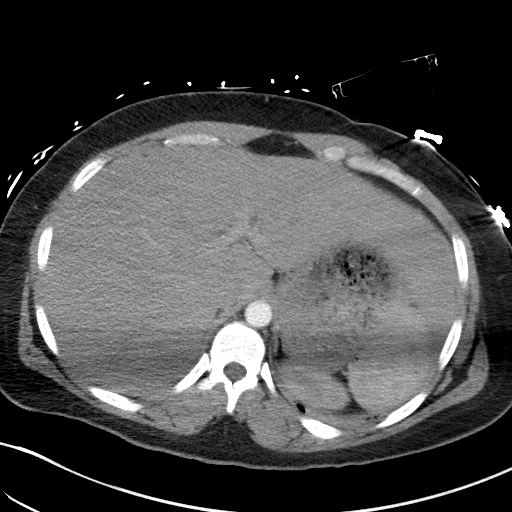
[im 15/59  lung]
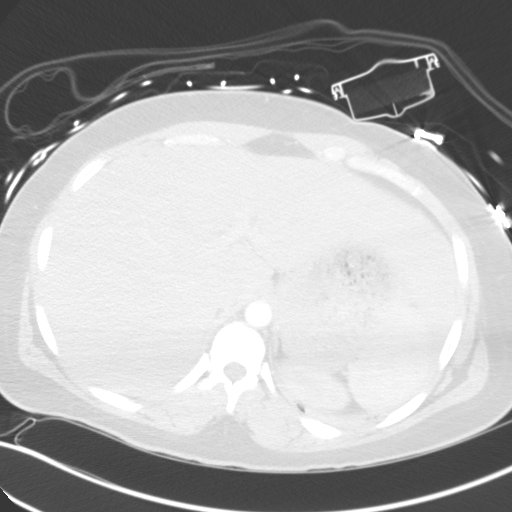
[im 30/59  lung]
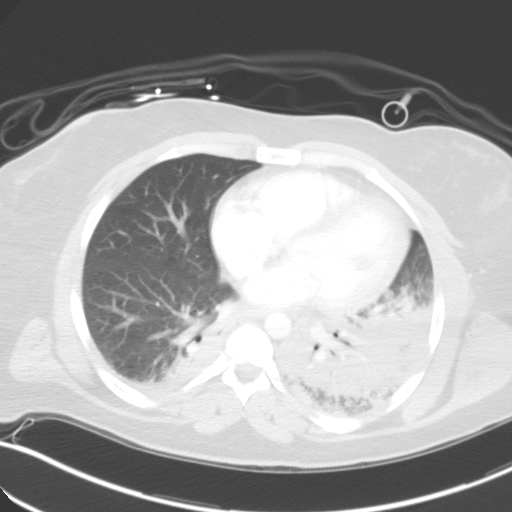
[im 44/59  lung]
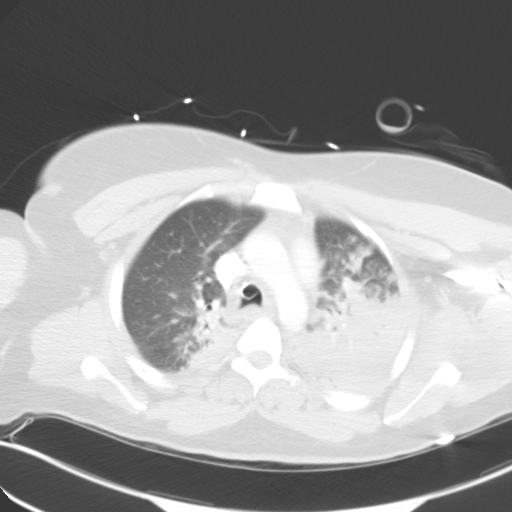

[Series 3: lungs · axial · 0.67mm/px · z∈[-391,-316]mm · 2 of 47 slices shown]
[im 16/47  lung]
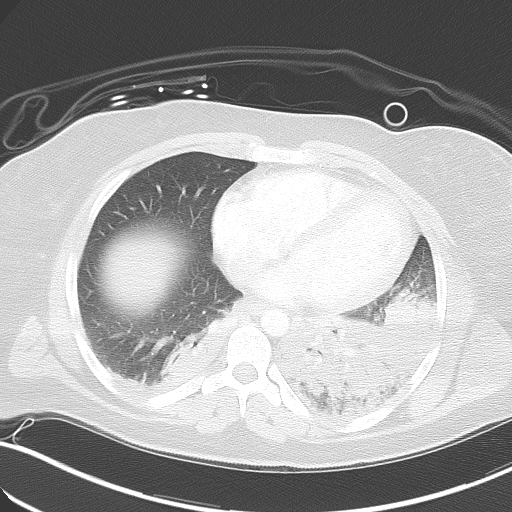
[im 31/47  lung]
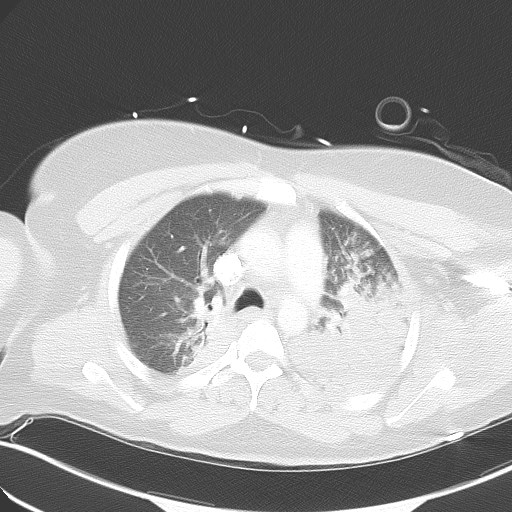

[Series 7: neck 2.0 i31s 3 · axial · 0.41mm/px · z∈[-285,-233]mm · 3 of 106 slices shown]
[im 14/106  lung]
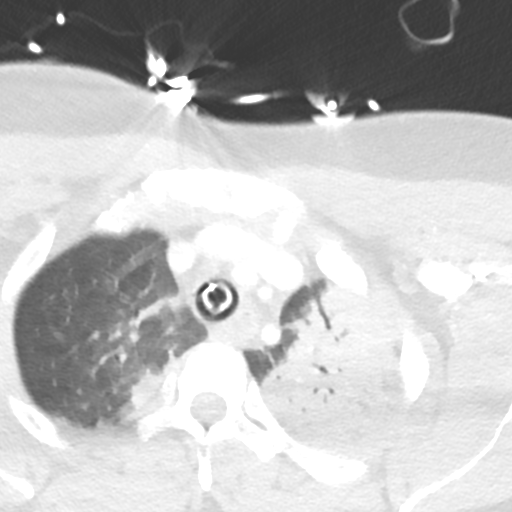
[im 27/106  lung]
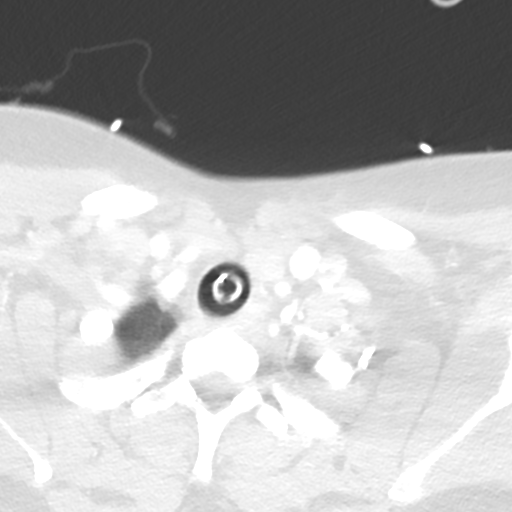
[im 40/106  lung]
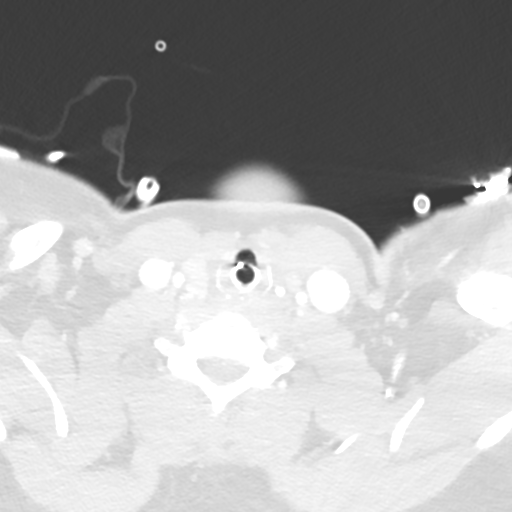

[Series 10: coronal st · coronal · 0.42mm/px · 1 of 101 slices shown]
[im 51/101  lung]
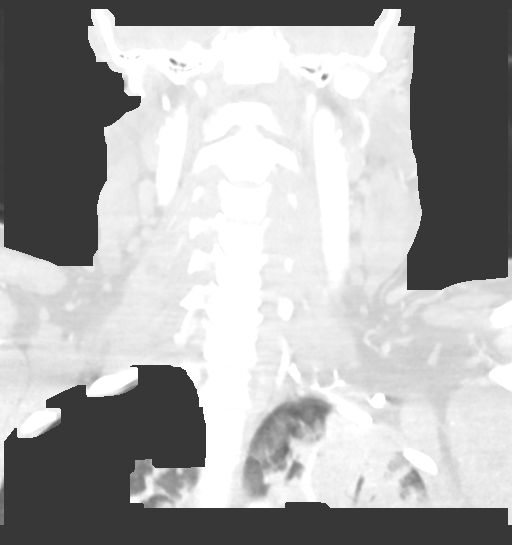

[Series 12: orthogonal st · axial · 0.39mm/px · z∈[-318,-140]mm · 8 of 119 slices shown]
[im 14/119  lung]
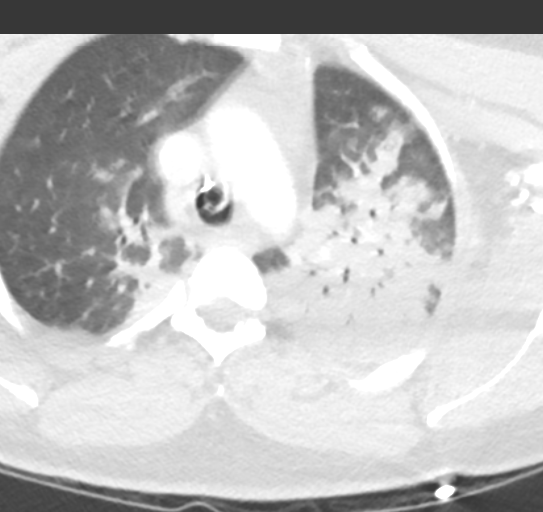
[im 27/119  lung]
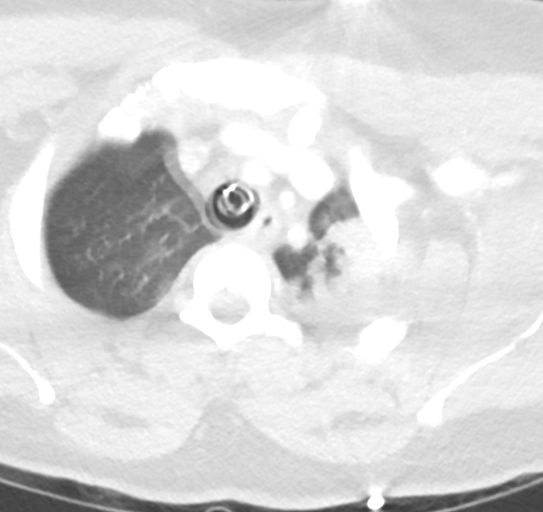
[im 40/119  lung]
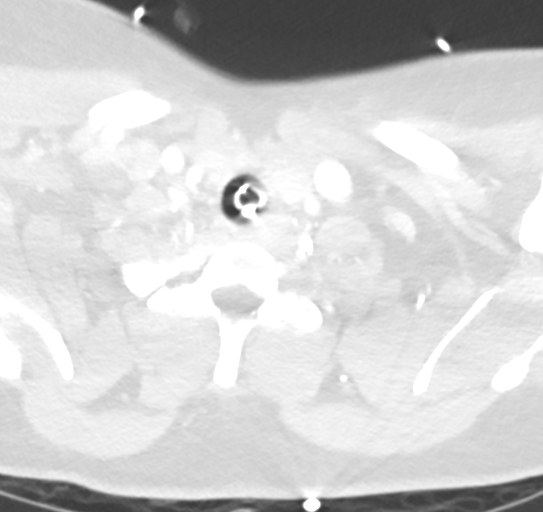
[im 53/119  lung]
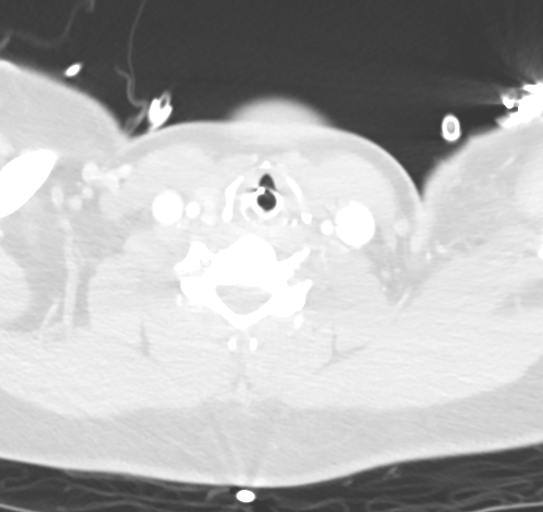
[im 66/119  lung]
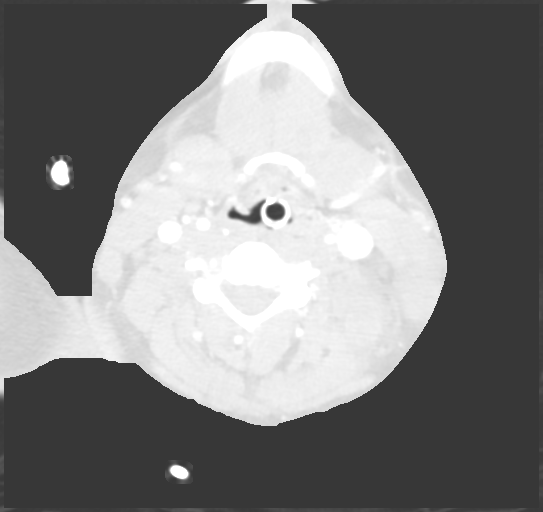
[im 79/119  lung]
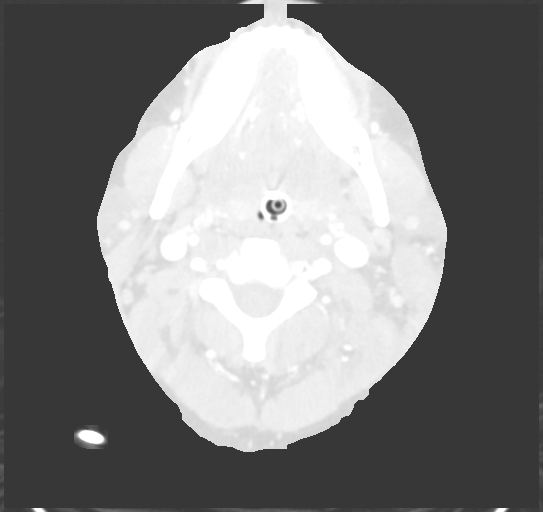
[im 92/119  lung]
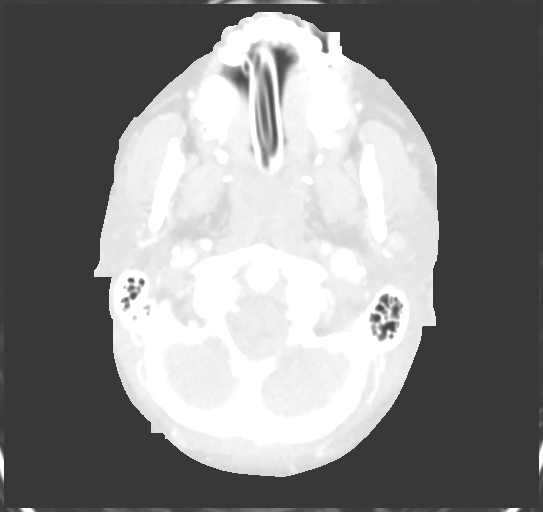
[im 105/119  lung]
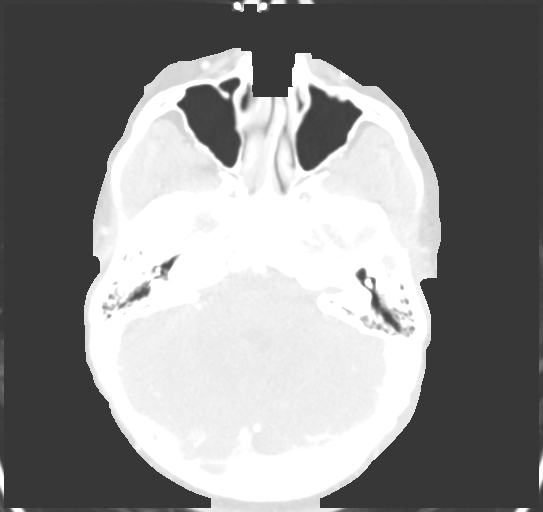

[17 of 36 positions shown; findings below may reference images not displayed]

FINDINGS: Cardiovascular: Thoracic aorta demonstrates normal caliber. Heart
size is nonenlarged. No pericardial effusion.

Mediastinum/Nodes: Endotracheal tube is present, the tip terminates
slightly above the carina. Minimal soft tissue density in the
anterior mediastinum likely reflects residual thymic tissue. Trachea
and mainstem bronchi otherwise within normal limits. No
pathologically enlarged mediastinal or hilar lymph nodes.

Lungs/Pleura: There is mild ground-glass density within the apical
portion of the right upper lobe with partial consolidation present.
There is mild consolidation within the right lower lobe as well.
There is extensive consolidation with air bronchogram present within
the left lower lobe and the left upper lobe. No pneumothorax. No
large pleural effusion.

Upper Abdomen: Visualized liver, gallbladder, spleen, pancreas,
adrenal glands and upper poles of the kidneys are within normal
limits. There is a 1 cm enhancing nodule anterior to the spleen,
probable splenule.

Musculoskeletal: There is no acute displaced fracture. Vertebral
bodies demonstrate normal stature. There is mild asymmetric soft
tissue thickening of the left lateral thoracic muscles. No
underlying acute osseous abnormality. No rim enhancing fluid
collections identified.
IMPRESSION: 1. Extensive airspace consolidation within the left upper and lower
lobes with additional ground-glass density and partial consolidation
present in the right upper and right lower lobes. Findings could be
secondary to multifocal pneumonia.
2. Mild asymmetric soft tissue thickening of the left lateral
thoracic muscles, possibly secondary to edema or contusion.
3. Probable small accessory splenule.

## 2017-04-11 IMAGING — CR DG ABD PORTABLE 1V
1 series · 1 of 1 positions shown · non-contrast
Comparison: None.

CLINICAL DATA: Nasogastric tube placement.  Initial encounter.

EXAM:
PORTABLE ABDOMEN - 1 VIEW

[AP]
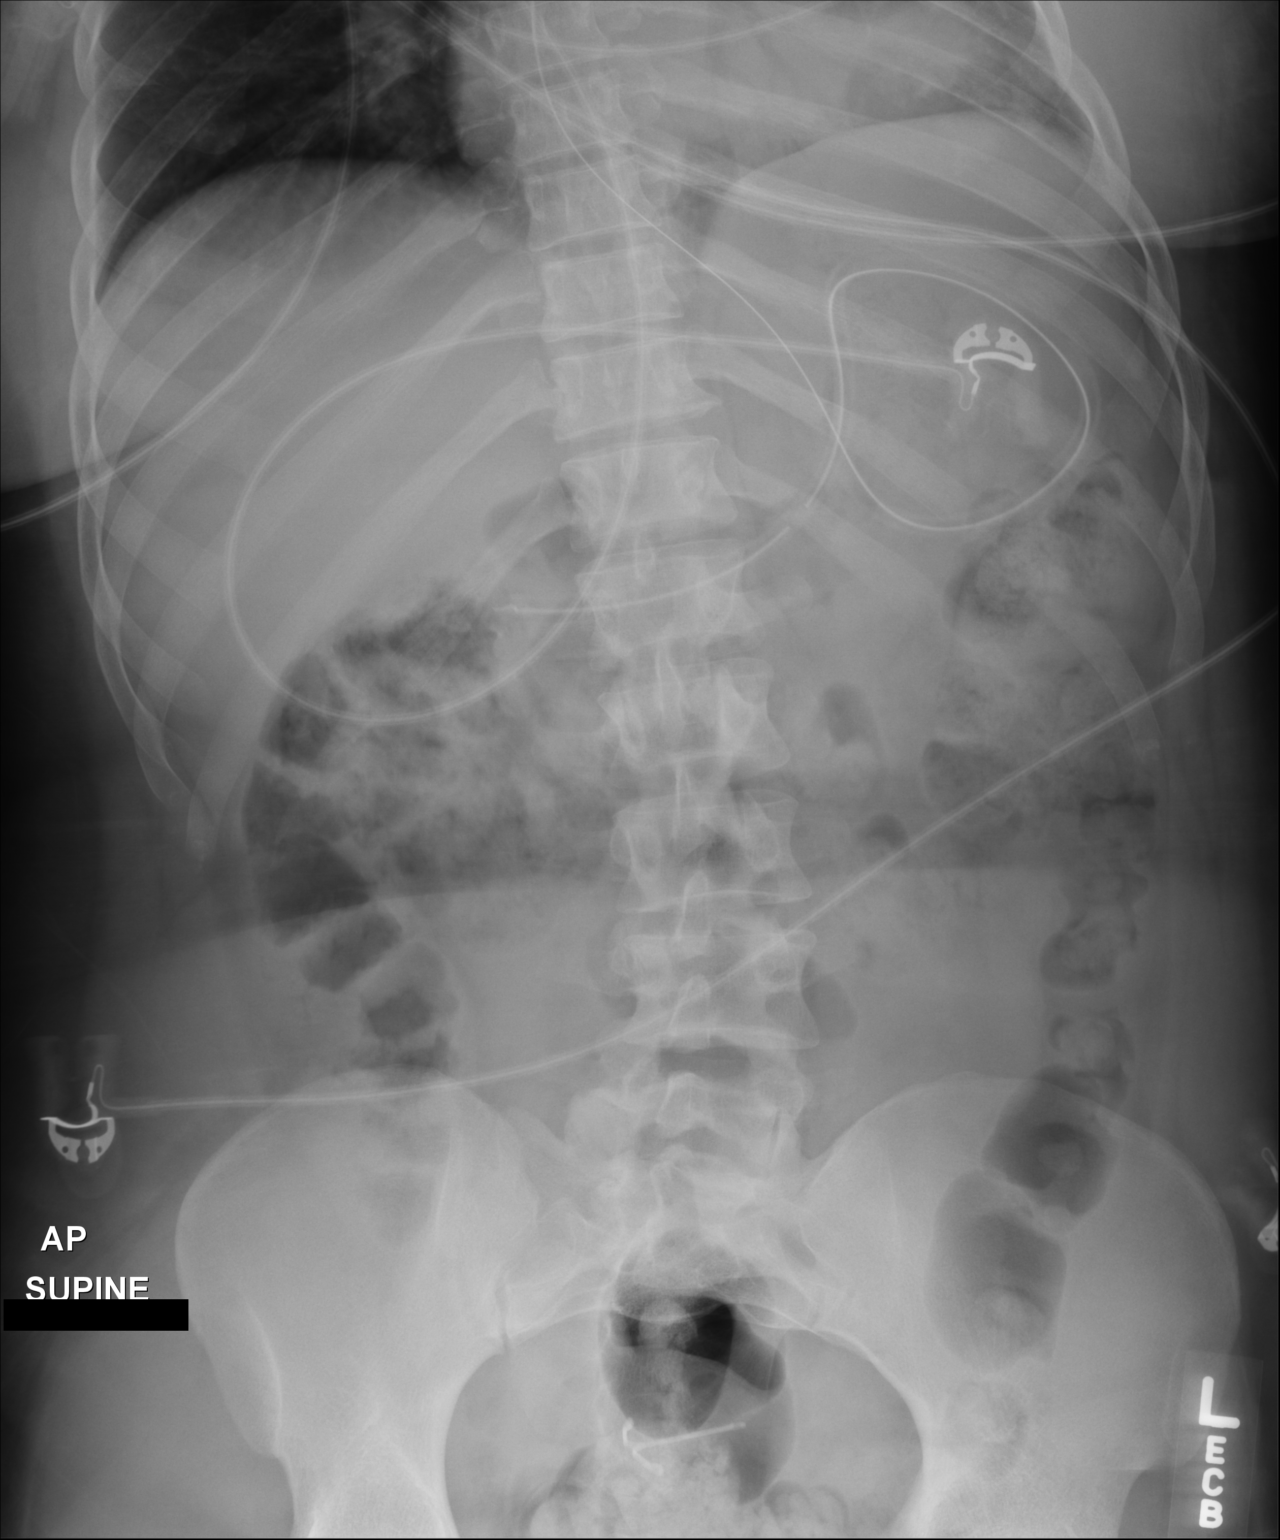

[1 of 1 positions shown; findings below may reference images not displayed]

FINDINGS: The patient's enteric tube is noted ending overlying the antrum.

The visualized bowel gas pattern is grossly unremarkable, with a
moderate amount of stool noted in the colon. An intrauterine device
is seen, directed towards the right. No free intra-abdominal air is
identified, though evaluation for free air is limited on a single
supine view. No acute osseous abnormalities are seen.
IMPRESSION: Enteric tube noted ending overlying the antrum.

## 2017-04-11 IMAGING — CR DG CHEST 1V PORT
1 series · 1 of 1 positions shown · non-contrast
Comparison: PA and lateral chest x-ray June 29, 2013

CLINICAL DATA: Hypoxia, substance abuse, current smoker.

EXAM:
PORTABLE CHEST 1 VIEW

[AP]
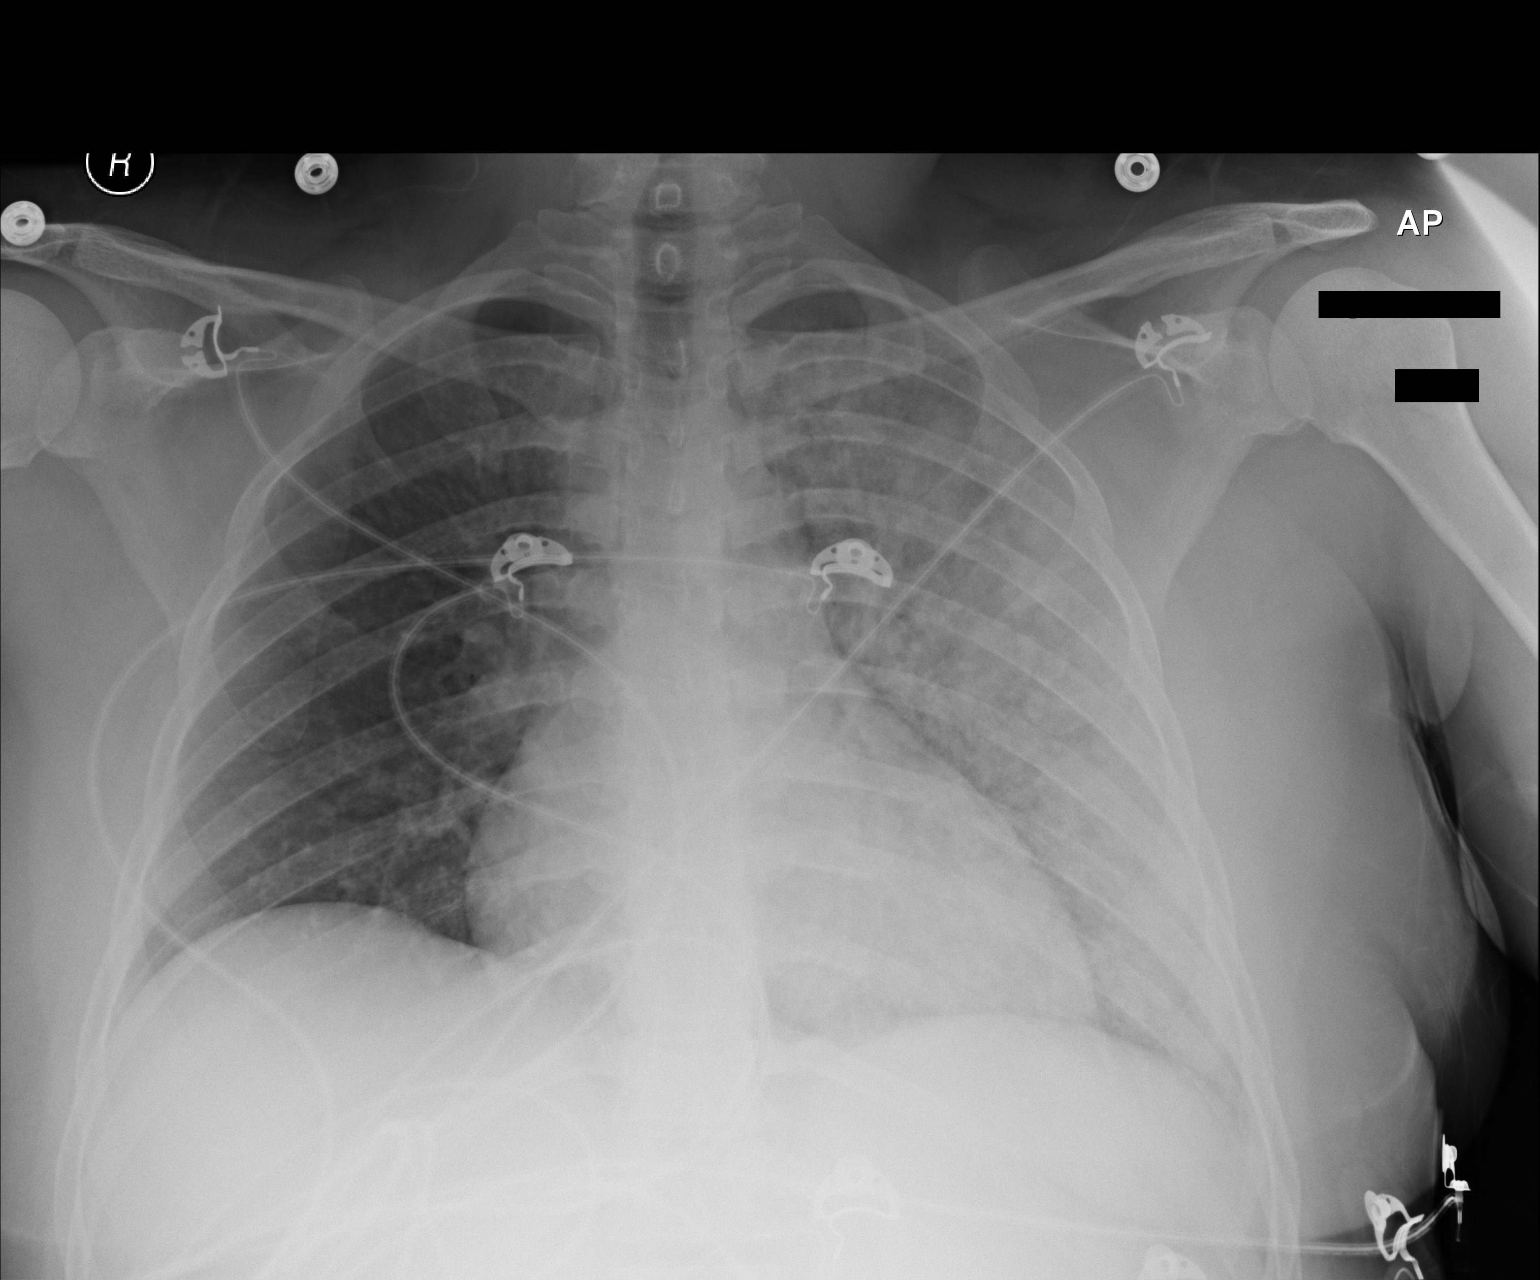

[1 of 1 positions shown; findings below may reference images not displayed]

FINDINGS: Diffuse interstitial and confluent alveolar opacities are present in
the left lung. The right lung is clear. The heart is top-normal in
size. The pulmonary vascularity is not engorged. There is no pleural
effusion or pneumothorax. The observed bony thorax is normal.
IMPRESSION: Interstitial and alveolar opacities throughout the left lung most
compatible with pneumonia. Asymmetric pulmonary edema is possible
but felt less likely.

Mild cardiomegaly without definite pulmonary vascular congestion.

## 2017-04-12 IMAGING — CR DG CHEST 1V PORT
1 series · 1 of 1 positions shown · non-contrast
Comparison: Radiograph October 30, 2015.

CLINICAL DATA: Fever, respiratory distress.

EXAM:
PORTABLE CHEST 1 VIEW

[AP]
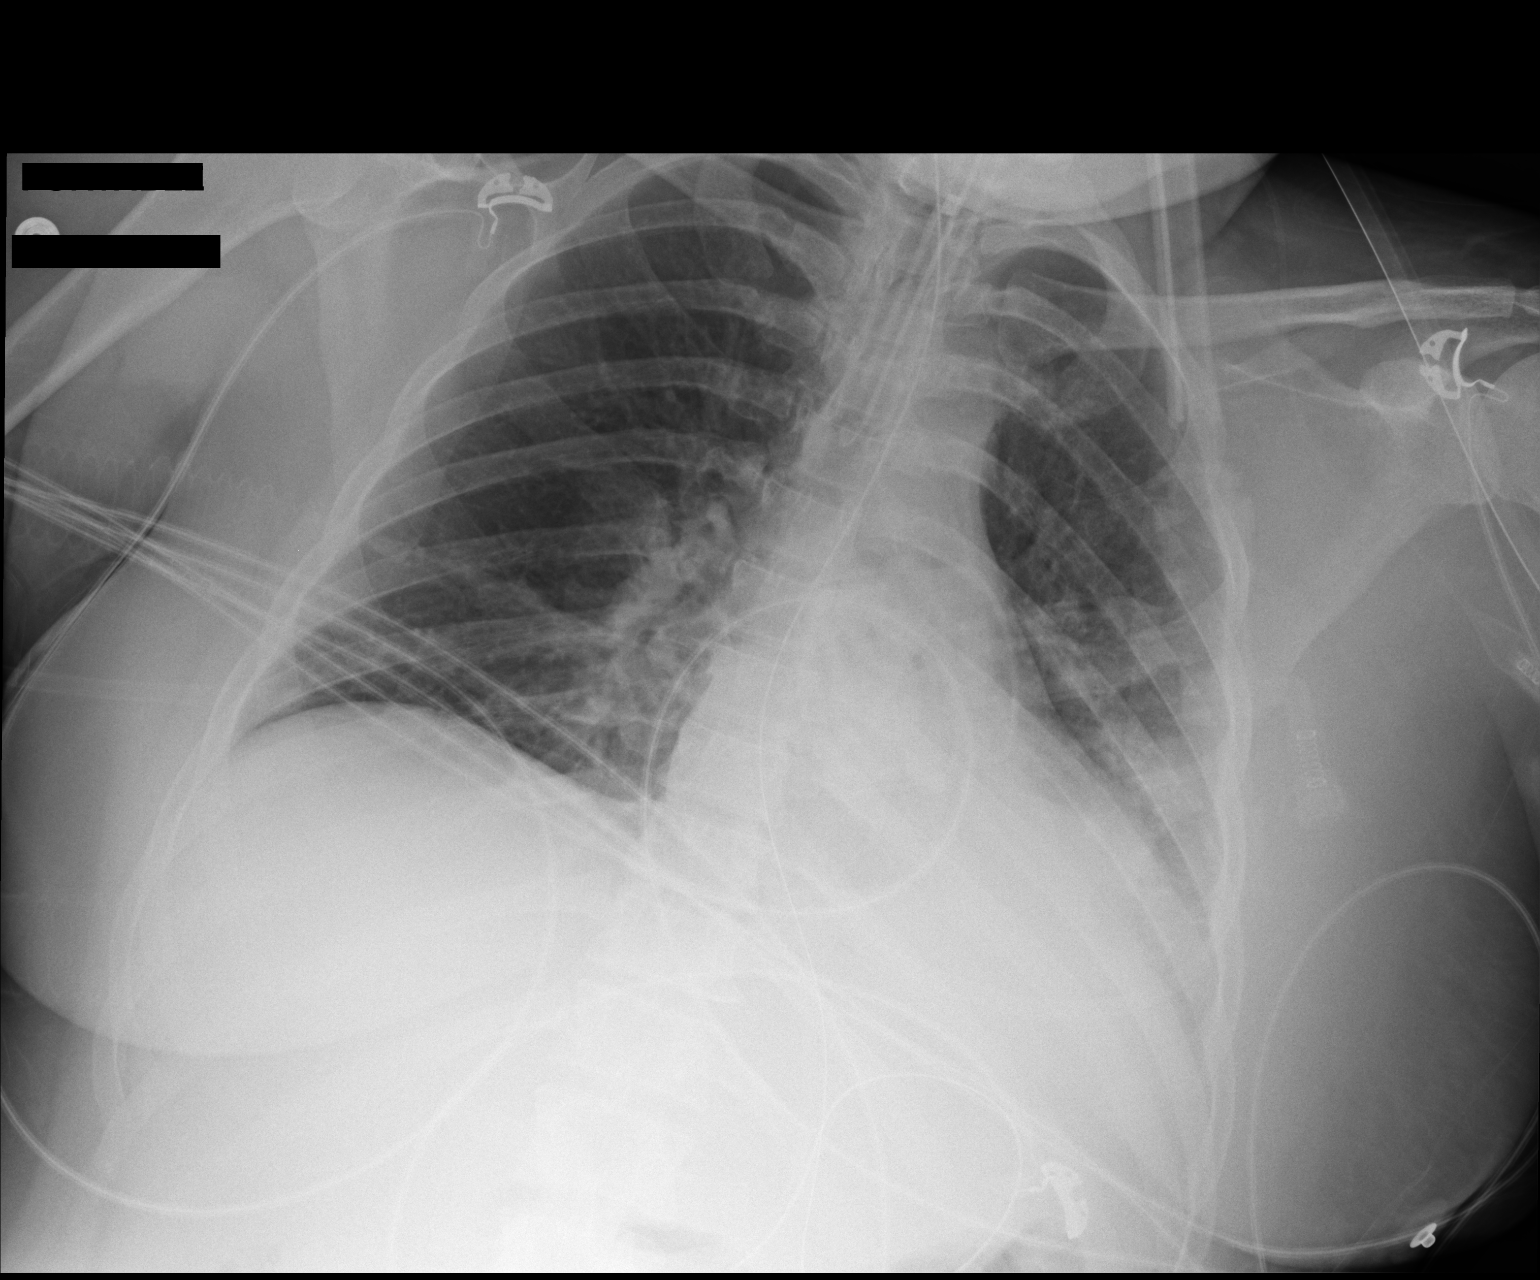

[1 of 1 positions shown; findings below may reference images not displayed]

FINDINGS: Stable cardiomediastinal silhouette. Endotracheal and nasogastric
tubes are unchanged in position. Right lung is clear. Mildly
improved left lung opacity is noted with significant persistent
basilar opacity concerning for atelectasis or infiltrate with
associated pleural effusion. Bony thorax is unremarkable.
IMPRESSION: Stable support apparatus. Slightly improved left lung opacity is
noted, with significant residual basilar opacity concerning for
atelectasis or pneumonia with probable associated pleural effusion.
